# Patient Record
Sex: Female | Born: 1950 | Race: Black or African American | Hispanic: No | Smoking: Former smoker
Health system: Southern US, Community
[De-identification: ages and names within clinical notes are randomized; demographics above are authoritative.]

## PROBLEM LIST (undated history)

## (undated) DIAGNOSIS — C189 Malignant neoplasm of colon, unspecified: Secondary | ICD-10-CM

## (undated) DIAGNOSIS — K921 Melena: Secondary | ICD-10-CM

## (undated) DIAGNOSIS — E119 Type 2 diabetes mellitus without complications: Secondary | ICD-10-CM

## (undated) DIAGNOSIS — D649 Anemia, unspecified: Secondary | ICD-10-CM

## (undated) DIAGNOSIS — I10 Essential (primary) hypertension: Secondary | ICD-10-CM

## (undated) DIAGNOSIS — R7302 Impaired glucose tolerance (oral): Secondary | ICD-10-CM

## (undated) DIAGNOSIS — Z86010 Personal history of colonic polyps: Secondary | ICD-10-CM

## (undated) DIAGNOSIS — E785 Hyperlipidemia, unspecified: Secondary | ICD-10-CM

## (undated) DIAGNOSIS — E669 Obesity, unspecified: Secondary | ICD-10-CM

## (undated) DIAGNOSIS — R413 Other amnesia: Secondary | ICD-10-CM

## (undated) HISTORY — DX: Essential (primary) hypertension: I10

## (undated) HISTORY — DX: Melena: K92.1

## (undated) HISTORY — DX: Anemia, unspecified: D64.9

## (undated) HISTORY — DX: Malignant neoplasm of colon, unspecified: C18.9

## (undated) HISTORY — DX: Type 2 diabetes mellitus without complications: E11.9

## (undated) HISTORY — DX: Personal history of colonic polyps: Z86.010

## (undated) HISTORY — DX: Hyperlipidemia, unspecified: E78.5

## (undated) HISTORY — DX: Obesity, unspecified: E66.9

## (undated) HISTORY — DX: Other amnesia: R41.3

## (undated) HISTORY — DX: Impaired glucose tolerance (oral): R73.02

---

## 1991-03-01 HISTORY — PX: OTHER SURGICAL HISTORY: SHX169

## 1993-02-28 HISTORY — PX: ABDOMINAL HYSTERECTOMY: SUR658

## 1996-10-08 DIAGNOSIS — Z860100 Personal history of colon polyps, unspecified: Secondary | ICD-10-CM

## 1996-10-08 HISTORY — DX: Personal history of colon polyps, unspecified: Z86.0100

## 1996-10-08 HISTORY — DX: Personal history of colonic polyps: Z86.010

## 1999-04-07 ENCOUNTER — Encounter: Payer: Self-pay | Admitting: Family Medicine

## 1999-04-07 ENCOUNTER — Encounter: Admission: RE | Admit: 1999-04-07 | Discharge: 1999-04-07 | Payer: Self-pay | Admitting: Family Medicine

## 2000-11-03 ENCOUNTER — Encounter: Admission: RE | Admit: 2000-11-03 | Discharge: 2000-11-03 | Payer: Self-pay | Admitting: Family Medicine

## 2000-11-03 ENCOUNTER — Encounter: Payer: Self-pay | Admitting: Family Medicine

## 2001-01-30 ENCOUNTER — Ambulatory Visit (HOSPITAL_COMMUNITY): Admission: RE | Admit: 2001-01-30 | Discharge: 2001-01-30 | Payer: Self-pay | Admitting: Gastroenterology

## 2001-02-22 ENCOUNTER — Other Ambulatory Visit: Admission: RE | Admit: 2001-02-22 | Discharge: 2001-02-22 | Payer: Self-pay | Admitting: Family Medicine

## 2001-03-08 ENCOUNTER — Encounter: Payer: Self-pay | Admitting: Family Medicine

## 2001-03-08 ENCOUNTER — Encounter: Admission: RE | Admit: 2001-03-08 | Discharge: 2001-03-08 | Payer: Self-pay | Admitting: Family Medicine

## 2004-11-10 ENCOUNTER — Other Ambulatory Visit: Admission: RE | Admit: 2004-11-10 | Discharge: 2004-11-10 | Payer: Self-pay | Admitting: Family Medicine

## 2004-11-29 ENCOUNTER — Encounter: Admission: RE | Admit: 2004-11-29 | Discharge: 2004-11-29 | Payer: Self-pay | Admitting: Family Medicine

## 2006-01-31 ENCOUNTER — Encounter: Admission: RE | Admit: 2006-01-31 | Discharge: 2006-01-31 | Payer: Self-pay | Admitting: Family Medicine

## 2007-12-31 ENCOUNTER — Encounter: Admission: RE | Admit: 2007-12-31 | Discharge: 2007-12-31 | Payer: Self-pay | Admitting: Family Medicine

## 2011-06-13 ENCOUNTER — Ambulatory Visit: Payer: Self-pay | Admitting: Internal Medicine

## 2011-06-17 ENCOUNTER — Encounter: Payer: Self-pay | Admitting: Internal Medicine

## 2011-06-17 DIAGNOSIS — Z0001 Encounter for general adult medical examination with abnormal findings: Secondary | ICD-10-CM | POA: Insufficient documentation

## 2011-06-17 DIAGNOSIS — Z0000 Encounter for general adult medical examination without abnormal findings: Secondary | ICD-10-CM | POA: Insufficient documentation

## 2011-06-21 ENCOUNTER — Ambulatory Visit (INDEPENDENT_AMBULATORY_CARE_PROVIDER_SITE_OTHER): Payer: Managed Care, Other (non HMO) | Admitting: Internal Medicine

## 2011-06-21 ENCOUNTER — Other Ambulatory Visit (INDEPENDENT_AMBULATORY_CARE_PROVIDER_SITE_OTHER): Payer: Managed Care, Other (non HMO)

## 2011-06-21 ENCOUNTER — Encounter: Payer: Self-pay | Admitting: Internal Medicine

## 2011-06-21 VITALS — BP 130/72 | HR 78 | Temp 98.3°F | Ht 63.0 in | Wt 193.2 lb

## 2011-06-21 DIAGNOSIS — R7302 Impaired glucose tolerance (oral): Secondary | ICD-10-CM

## 2011-06-21 DIAGNOSIS — E119 Type 2 diabetes mellitus without complications: Secondary | ICD-10-CM | POA: Insufficient documentation

## 2011-06-21 DIAGNOSIS — Z0000 Encounter for general adult medical examination without abnormal findings: Secondary | ICD-10-CM

## 2011-06-21 DIAGNOSIS — I10 Essential (primary) hypertension: Secondary | ICD-10-CM

## 2011-06-21 DIAGNOSIS — K635 Polyp of colon: Secondary | ICD-10-CM | POA: Insufficient documentation

## 2011-06-21 DIAGNOSIS — K921 Melena: Secondary | ICD-10-CM

## 2011-06-21 DIAGNOSIS — R7309 Other abnormal glucose: Secondary | ICD-10-CM

## 2011-06-21 DIAGNOSIS — E669 Obesity, unspecified: Secondary | ICD-10-CM

## 2011-06-21 DIAGNOSIS — D126 Benign neoplasm of colon, unspecified: Secondary | ICD-10-CM

## 2011-06-21 DIAGNOSIS — E785 Hyperlipidemia, unspecified: Secondary | ICD-10-CM

## 2011-06-21 HISTORY — DX: Melena: K92.1

## 2011-06-21 HISTORY — DX: Hyperlipidemia, unspecified: E78.5

## 2011-06-21 HISTORY — DX: Impaired glucose tolerance (oral): R73.02

## 2011-06-21 HISTORY — DX: Obesity, unspecified: E66.9

## 2011-06-21 HISTORY — DX: Essential (primary) hypertension: I10

## 2011-06-21 LAB — BASIC METABOLIC PANEL
Calcium: 9.3 mg/dL (ref 8.4–10.5)
GFR: 105.95 mL/min (ref >60.00)
Potassium: 4.1 mEq/L (ref 3.5–5.1)
Sodium: 137 mEq/L (ref 135–145)

## 2011-06-21 LAB — HEPATIC FUNCTION PANEL
Alkaline Phosphatase: 74 U/L (ref 39–117)
Bilirubin, Direct: 0.1 mg/dL (ref 0.0–0.3)

## 2011-06-21 LAB — URINALYSIS, ROUTINE W REFLEX MICROSCOPIC
Bilirubin Urine: NEGATIVE
Ketones, ur: NEGATIVE
Specific Gravity, Urine: 1.025 (ref 1.000–1.030)
Urine Glucose: NEGATIVE
Urobilinogen, UA: 0.2 (ref 0.0–1.0)

## 2011-06-21 LAB — LIPID PANEL
HDL: 57.4 mg/dL (ref >39.00)
Total CHOL/HDL Ratio: 5
Triglycerides: 118 mg/dL (ref 0.0–149.0)

## 2011-06-21 LAB — CBC WITH DIFFERENTIAL/PLATELET
Basophils Absolute: 0 10*3/uL (ref 0.0–0.1)
Eosinophils Absolute: 0.2 10*3/uL (ref 0.0–0.7)
HCT: 34.9 % — ABNORMAL LOW (ref 36.0–46.0)
Hemoglobin: 11.1 g/dL — ABNORMAL LOW (ref 12.0–15.0)
Lymphs Abs: 2.2 10*3/uL (ref 0.7–4.0)
MCHC: 31.7 g/dL (ref 30.0–36.0)
MCV: 71 fl — ABNORMAL LOW (ref 78.0–100.0)
Monocytes Absolute: 0.5 10*3/uL (ref 0.1–1.0)
Neutro Abs: 5.5 10*3/uL (ref 1.4–7.7)
RDW: 17.2 % — ABNORMAL HIGH (ref 11.5–14.6)

## 2011-06-21 NOTE — Assessment & Plan Note (Signed)

## 2011-06-21 NOTE — Patient Instructions (Signed)
Please call for insurance to see if the shingles shot is covered You will be contacted regarding the referral for: colonoscopy Please go to LAB in the Basement for the blood and/or urine tests to be done today You will be contacted by phone if any changes need to be made immediately.  Otherwise, you will receive a letter about your results with an explanation. Please return in 1 year for your yearly visit, or sooner if needed, with Lab testing done 3-5 days before

## 2011-06-21 NOTE — Assessment & Plan Note (Signed)
stable overall by hx and exam, , and pt to continue medical treatment as before   

## 2011-06-21 NOTE — Progress Notes (Signed)
Subjective:    Patient ID: Sarah Berger, female    DOB: 1950/04/08, 61 y.o.   MRN: [id number]  HPI  Here to establish as new pt, formerly pt of Dr Arvilla Market now retired. Co-workers are pt's of mine as well. Here for wellness and f/u;  Overall doing ok;  Pt denies CP, worsening SOB, DOE, wheezing, orthopnea, PND, worsening LE edema, palpitations, dizziness or syncope.  Pt denies neurological change such as new Headache, facial or extremity weakness.  Pt denies polydipsia, polyuria, or low sugar symptoms. Pt states overall good compliance with treatment and medications, good tolerability, and trying to follow lower cholesterol diet.  Pt denies worsening depressive symptoms, suicidal ideation or panic. No fever, wt loss, night sweats, loss of appetite, or other constitutional symptoms.  Pt states good ability with ADL's, low fall risk, home safety reviewed and adequate, no significant changes in hearing or vision, and occasionally active with exercise. Did have an episode of small volume BRBPR painless to commode , no recurrence.  Has hx of colonoscopy x 2 (last approx 10 yrs) with hx of polyps on first, none on second.  Mother with hx of colon polyps as well. Past Medical History  Diagnosis Date  . Blood in stool   . Obesity 06/21/2011  . Impaired glucose tolerance 06/21/2011  . Hyperlipidemia 06/21/2011  . HTN (hypertension) 06/21/2011    Several mild readings in the past, has decline med tx  . Colon polyps 06/21/2011  . Hematochezia 06/21/2011   Past Surgical History  Procedure Date  . Abdominal hysterectomy   . Abdominal hysterectomy     reports that she has quit smoking. She has never used smokeless tobacco. She reports that she does not drink alcohol or use illicit drugs. family history includes Hypertension in her mother. No Known Allergies No current outpatient prescriptions on file prior to visit.   Review of Systems Review of Systems  Constitutional: Negative for diaphoresis, activity  change, appetite change and unexpected weight change.  HENT: Negative for hearing loss, ear pain, facial swelling, mouth sores and neck stiffness.   Eyes: Negative for pain, redness and visual disturbance.  Respiratory: Negative for shortness of breath and wheezing.   Cardiovascular: Negative for chest pain and palpitations.  Gastrointestinal: Negative for diarrhea, blood in stool, abdominal distention and rectal pain.  Genitourinary: Negative for hematuria, flank pain and decreased urine volume.  Musculoskeletal: Negative for myalgias and joint swelling.  Skin: Negative for color change and wound.  Neurological: Negative for syncope and numbness.  Hematological: Negative for adenopathy.  Psychiatric/Behavioral: Negative for hallucinations, self-injury, decreased concentration and agitation.      Objective:   Physical Exam BP 130/72  Pulse 78  Temp(Src) 98.3 F (36.8 C) (Oral)  Ht 5\' 3"  (1.6 m)  Wt 193 lb 4 oz (87.658 kg)  BMI 34.23 kg/m2  SpO2 98% Physical Exam  VS noted Constitutional: Pt is oriented to person, place, and time. Appears well-developed and well-nourished.  HENT:  Head: Normocephalic and atraumatic.  Right Ear: External ear normal.  Left Ear: External ear normal.  Nose: Nose normal.  Mouth/Throat: Oropharynx is clear and moist.  Eyes: Conjunctivae and EOM are normal. Pupils are equal, round, and reactive to light.  Neck: Normal range of motion. Neck supple. No JVD present. No tracheal deviation present.  Cardiovascular: Normal rate, regular rhythm, normal heart sounds and intact distal pulses.   Pulmonary/Chest: Effort normal and breath sounds normal.  Abdominal: Soft. Bowel sounds are normal. There is no  tenderness.  Musculoskeletal: Normal range of motion. Exhibits no edema.  Lymphadenopathy:  Has no cervical adenopathy.  Neurological: Pt is alert and oriented to person, place, and time. Pt has normal reflexes. No cranial nerve deficit.  Skin: Skin is warm  and dry. No rash noted.  Psychiatric:  Has  normal mood and affect. Behavior is normal.    Assessment & Plan:

## 2011-06-22 ENCOUNTER — Encounter: Payer: Self-pay | Admitting: Internal Medicine

## 2011-06-22 ENCOUNTER — Other Ambulatory Visit: Payer: Self-pay | Admitting: Internal Medicine

## 2011-06-22 DIAGNOSIS — E119 Type 2 diabetes mellitus without complications: Secondary | ICD-10-CM

## 2011-06-22 LAB — IBC PANEL
Saturation Ratios: 19.2 % — ABNORMAL LOW (ref 20.0–50.0)
Transferrin: 286.7 mg/dL (ref 212.0–360.0)

## 2011-06-22 MED ORDER — METFORMIN HCL 500 MG PO TABS: 500.0000 mg | ORAL_TABLET | Freq: Two times a day (BID) | ORAL | Status: DC

## 2011-06-22 MED ORDER — ATORVASTATIN CALCIUM 20 MG PO TABS: 20.0000 mg | ORAL_TABLET | Freq: Every day | ORAL | Status: DC

## 2011-06-22 MED ORDER — ASPIRIN 81 MG PO TBEC: 81.0000 mg | DELAYED_RELEASE_TABLET | Freq: Every day | ORAL | Status: AC

## 2011-06-23 ENCOUNTER — Encounter: Payer: Self-pay | Admitting: Internal Medicine

## 2011-07-14 ENCOUNTER — Telehealth: Payer: Self-pay | Admitting: Internal Medicine

## 2011-07-14 ENCOUNTER — Encounter: Payer: Self-pay | Admitting: Internal Medicine

## 2011-07-14 NOTE — Telephone Encounter (Signed)
Received copies from [facility] ,on 07/14/11. Forwarded 12 pages to Dr. Rhea Belton ,for review.

## 2011-07-18 ENCOUNTER — Encounter: Payer: Self-pay | Admitting: Internal Medicine

## 2011-07-18 ENCOUNTER — Ambulatory Visit (INDEPENDENT_AMBULATORY_CARE_PROVIDER_SITE_OTHER): Payer: Managed Care, Other (non HMO) | Admitting: Internal Medicine

## 2011-07-18 VITALS — BP 124/68 | HR 80 | Ht 63.0 in | Wt 194.0 lb

## 2011-07-18 DIAGNOSIS — Z86010 Personal history of colonic polyps: Secondary | ICD-10-CM

## 2011-07-18 DIAGNOSIS — D126 Benign neoplasm of colon, unspecified: Secondary | ICD-10-CM

## 2011-07-18 DIAGNOSIS — K921 Melena: Secondary | ICD-10-CM

## 2011-07-18 DIAGNOSIS — K635 Polyp of colon: Secondary | ICD-10-CM

## 2011-07-18 MED ORDER — PEG-KCL-NACL-NASULF-NA ASC-C 100 G PO SOLR
1.0000 | Freq: Once | ORAL | Status: DC
Start: 2011-07-18 — End: 2011-08-23

## 2011-07-18 NOTE — Patient Instructions (Signed)
You have been scheduled for a colonoscopy with propofol. Please follow written instructions given to you at your visit today.  Please pick up your prep kit at the pharmacy within the next 1-3 days.  We have sent the following medications to your pharmacy for you to pick up at your convenience: Moviprep; you were given instructions today at your visit.    

## 2011-07-19 ENCOUNTER — Encounter: Payer: Self-pay | Admitting: Internal Medicine

## 2011-07-19 NOTE — Progress Notes (Signed)
Subjective:    Patient ID: Sarah Berger, female    DOB: 1951/01/04, 61 y.o.   MRN: [id number]  HPI Sarah Berger is a 61 year old female the past medical history of hypertension, hyperlipidemia and colon polyps who seen in consultation at the request of Dr. Jonny Ruiz for evaluation of an isolated episode of hematochezia. The patient reports that approximately one month ago she developed an isolated episode of hematochezia. This was painless in nature. It has not recurred. Over all, and otherwise she is doing well. She reports one to 2 bowel movements per day which are soft and formed. Her stools are brown without blood or melena of late. She denies abdominal pain. No diarrhea. No constipation. Appetite is good. No loss of weight. No nausea or vomiting. No fevers or chills  She does have a history of 2 prior colonoscopies and she notes a history of colon polyps, unknown histopathologic type.  Review of Systems As per history of present illness, otherwise neg  Patient Active Problem List  Diagnoses  . Preventative health care  . Obesity  . Impaired glucose tolerance  . Hyperlipidemia  . HTN (hypertension)  . Colon polyps  . Hematochezia   Past Surgical History  Procedure Date  . Abdominal hysterectomy    Current Outpatient Prescriptions  Medication Sig Dispense Refill  . aspirin 81 MG EC tablet Take 1 tablet (81 mg total) by mouth daily. Swallow whole.  30 tablet  12  . atorvastatin (LIPITOR) 20 MG tablet Take 1 tablet (20 mg total) by mouth daily.  90 tablet  3  . metFORMIN (GLUCOPHAGE) 500 MG tablet Take 1 tablet (500 mg total) by mouth 2 (two) times daily with a meal.  180 tablet  3  . peg 3350 powder (MOVIPREP) 100 G SOLR Take 1 kit (100 g total) by mouth once.  1 kit  0   No Known Allergies  Family History  Problem Relation Age of Onset  . Hypertension Mother   . Colon cancer Neg Hx   . Colon polyps Mother    History  Substance Use Topics  . Smoking status: Former Games developer  .  Smokeless tobacco: Never Used   Comment: quit 30 years ago.  . Alcohol Use: No       Objective:   Physical Exam BP 124/68  Pulse 80  Ht 5\' 3"  (1.6 m)  Wt 194 lb (87.998 kg)  BMI 34.37 kg/m2 Constitutional: Well-developed and well-nourished. No distress. HEENT: Normocephalic and atraumatic. Oropharynx is clear and moist. No oropharyngeal exudate. Conjunctivae are normal. Pupils are equal round and reactive to light. No scleral icterus. Neck: Neck supple. Trachea midline. Cardiovascular: Normal rate, regular rhythm and intact distal pulses. No M/R/G Pulmonary/chest: Effort normal and breath sounds normal. No wheezing, rales or rhonchi. Abdominal: Soft, nontender, nondistended. Bowel sounds active throughout. There are no masses palpable. No hepatosplenomegaly. Extremities: no clubbing, cyanosis, or edema Lymphadenopathy: No cervical adenopathy noted. Neurological: Alert and oriented to person place and time. Skin: Skin is warm and dry. No rashes noted. Psychiatric: Normal mood and affect. Behavior is normal.  CBC    Component Value Date/Time   WBC 8.4 06/21/2011 1033   RBC 4.92 06/21/2011 1033   HGB 11.1* 06/21/2011 1033   HCT 34.9* 06/21/2011 1033   PLT 273.0 06/21/2011 1033   MCV 71.0* 06/21/2011 1033   MCHC 31.7 06/21/2011 1033   RDW 17.2* 06/21/2011 1033   LYMPHSABS 2.2 06/21/2011 1033   MONOABS 0.5 06/21/2011 1033   EOSABS 0.2  06/21/2011 1033   BASOSABS 0.0 06/21/2011 1033   CMP     Component Value Date/Time   NA 137 06/21/2011 1033   K 4.1 06/21/2011 1033   CL 101 06/21/2011 1033   CO2 26 06/21/2011 1033   GLUCOSE 171* 06/21/2011 1033   BUN 11 06/21/2011 1033   CREATININE 0.7 06/21/2011 1033   CALCIUM 9.3 06/21/2011 1033   PROT 8.5* 06/21/2011 1033   ALBUMIN 4.2 06/21/2011 1033   AST 17 06/21/2011 1033   ALT 12 06/21/2011 1033   ALKPHOS 74 06/21/2011 1033   BILITOT 0.5 06/21/2011 1033   Iron/TIBC/Ferritin    Component Value Date/Time   IRON 77 06/21/2011 1033   Prior  colonoscopies: #1. 10/08/1996 -- 2.0 cm pedunculated polyp removed in the sigmoid, no other polyps or tumors seen. The remainder of the colon was normal. Procedure was performed due to anemia, was to the cecum with good prep. #2. 02/23/2006 -- sessile polyp in the mid ascending colon, 7 mm in size, removed with saline lift injection technique using hot snare. Resection retrieval complete. Multiple diverticula were found in the sigmoid colon. There was mild tortuosity but no stricture or diverticulitis or bleeding.    Assessment & Plan:  61 year old female the past medical history of hypertension, hyperlipidemia and colon polyps who seen in consultation at the request of Dr. Jonny Ruiz for evaluation of an isolated episode of hematochezia  1. Isolated hematochezia/history of colon polyps -- based on the patient's prior history of colon polyps, she is due for repeat surveillance colonoscopy now. This test will also suffice to better evaluate her isolated episode of hematochezia. We discussed the procedure including the risks and benefits, and she is agreeable to proceed. Her anemia, appears microcytic, and is long-standing. RR level was normal as was her TIBC. No ferritin was drawn. This is likely a mixed picture anemia. We will proceed with colonoscopy.  Further recommended history made after colonoscopy.

## 2011-08-08 ENCOUNTER — Ambulatory Visit: Payer: Managed Care, Other (non HMO) | Admitting: Internal Medicine

## 2011-08-23 ENCOUNTER — Encounter: Payer: Self-pay | Admitting: Internal Medicine

## 2011-08-23 ENCOUNTER — Telehealth: Payer: Self-pay

## 2011-08-23 ENCOUNTER — Telehealth: Payer: Self-pay | Admitting: *Deleted

## 2011-08-23 ENCOUNTER — Other Ambulatory Visit (INDEPENDENT_AMBULATORY_CARE_PROVIDER_SITE_OTHER): Payer: Managed Care, Other (non HMO)

## 2011-08-23 ENCOUNTER — Ambulatory Visit (AMBULATORY_SURGERY_CENTER): Payer: Managed Care, Other (non HMO) | Admitting: Internal Medicine

## 2011-08-23 VITALS — BP 122/75 | HR 90 | Temp 96.1°F | Resp 18 | Ht 63.0 in | Wt 194.0 lb

## 2011-08-23 DIAGNOSIS — K635 Polyp of colon: Secondary | ICD-10-CM

## 2011-08-23 DIAGNOSIS — K6389 Other specified diseases of intestine: Secondary | ICD-10-CM

## 2011-08-23 DIAGNOSIS — D126 Benign neoplasm of colon, unspecified: Secondary | ICD-10-CM

## 2011-08-23 DIAGNOSIS — Z86010 Personal history of colonic polyps: Secondary | ICD-10-CM

## 2011-08-23 DIAGNOSIS — K921 Melena: Secondary | ICD-10-CM

## 2011-08-23 LAB — COMPREHENSIVE METABOLIC PANEL
BUN: 9 mg/dL (ref 6–23)
CO2: 25 mEq/L (ref 19–32)
Calcium: 8.9 mg/dL (ref 8.4–10.5)
Chloride: 107 mEq/L (ref 96–112)
Creatinine, Ser: 0.7 mg/dL (ref 0.4–1.2)
GFR: 105.89 mL/min (ref >60.00)
Glucose, Bld: 122 mg/dL — ABNORMAL HIGH (ref 70–99)

## 2011-08-23 MED ORDER — SODIUM CHLORIDE 0.9 % IV SOLN: 500.0000 mL | INTRAVENOUS | Status: DC

## 2011-08-23 NOTE — Op Note (Signed)
[facility] [address] [place], [place]  [address]  COLONOSCOPY PROCEDURE REPORT  PATIENT:  Sarah Berger, Sarah Berger  MR#:  [id number] BIRTHDATE:  1950/06/03, 61 yrs. old  GENDER:  female ENDOSCOPIST:  Carie Caddy. Jim Lundin, MD REF. BY:  Oliver Barre, M.D. PROCEDURE DATE:  08/23/2011 PROCEDURE:  Colonoscopy with biopsy ASA CLASS:  Class II INDICATIONS:  hematochezia, history of pre-cancerous (adenomatous) colon polyps, last colonoscopy 2007 MEDICATIONS:   MAC sedation, administered by CRNA, propofol (Diprivan) 250 mg IV  DESCRIPTION OF PROCEDURE:   After the risks benefits and alternatives of the procedure were thoroughly explained, informed consent was obtained.  Digital rectal exam was performed and revealed no abnormalities.   The LB CF-[id number] endoscope was introduced through the anus and advanced to the terminal ileum which was intubated for a short distance, without limitations. The quality of the prep was good, using MoviPrep.  The instrument was then slowly withdrawn as the colon was fully examined. <<PROCEDUREIMAGES>>  FINDINGS:  The terminal ileum appeared normal.  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 This mass involved 50-75% of the colonic lumen and was approximately 5 cm in length.  It is non-obstructing at present.  Multiple biopsies were obtained and sent to pathology.  Tattoo was placed using [place] ink at the proximal and distal margins.  Mild diverticulosis was found in the left colon.   Retroflexed views in the rectum revealed internal hemorrhoids.    The scope was then withdrawn from the cecum and the procedure completed.  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  Multiple biopsies performed and sent to pathology.  Tattoo placed. 3) Mild diverticulosis in the left colon 4) Internal hemorrhoids  RECOMMENDATIONS: 1) Await pathology results 2) CT scan chest,  abdomen and pelvis 3) Referral to general surgery  [name] M. Rhea Belton, MD  CC:  Corwin Levins, MD The Patient  n. eSIGNED:   Carie Caddy. [name] at 08/23/2011 02:12 PM  [name], [phone]

## 2011-08-23 NOTE — Telephone Encounter (Signed)
TOOK INSTRUCTIONS AND PREP TO PT IN [facility].

## 2011-08-23 NOTE — Progress Notes (Signed)
Patient did not experience any of the following events: a burn prior to discharge; a fall within the facility; wrong site/side/patient/procedure/implant event; or a hospital transfer or hospital admission upon discharge from the facility. (G8907) Patient did not have preoperative order for IV antibiotic SSI prophylaxis. (G8918)  

## 2011-08-23 NOTE — Patient Instructions (Addendum)
Findings:  Mass in the ascending colon.  Multiple biopsies taken and sent to pathology.  Mild diverticulosis and internal hemorrhoids. Recommendations:  Wait for biopsy results, CT scan chest, abd, and pelvis.  Referral to general surgery.  CT SCAN SCHEDULED FOR June 27th 2013 AT [place] [facility] [place].  BE THERE AT 9:45 am.  Instructions given for constrast.    YOU HAD AN ENDOSCOPIC PROCEDURE TODAY AT THE [facility]: Refer to the procedure report that was given to you for any specific questions about what was found during the examination.  If the procedure report does not answer your questions, please call your gastroenterologist to clarify.  If you requested that your care partner not be given the details of your procedure findings, then the procedure report has been included in a sealed envelope for you to review at your convenience later.  YOU SHOULD EXPECT: Some feelings of bloating in the abdomen. Passage of more gas than usual.  Walking can help get rid of the air that was put into your GI tract during the procedure and reduce the bloating. If you had a lower endoscopy (such as a colonoscopy or flexible sigmoidoscopy) you may notice spotting of blood in your stool or on the toilet paper. If you underwent a bowel prep for your procedure, then you may not have a normal bowel movement for a few days.  DIET: Your first meal following the procedure should be a light meal and then it is ok to progress to your normal diet.  A half-sandwich or bowl of soup is an example of a good first meal.  Heavy or fried foods are harder to digest and may make you feel nauseous or bloated.  Likewise meals heavy in dairy and vegetables can cause extra gas to form and this can also increase the bloating.  Drink plenty of fluids but you should avoid alcoholic beverages for 24 hours.  ACTIVITY: Your care partner should take you home directly after the procedure.  You should plan to take it easy, moving slowly  for the rest of the day.  You can resume normal activity the day after the procedure however you should NOT DRIVE or use heavy machinery for 24 hours (because of the sedation medicines used during the test).    SYMPTOMS TO REPORT IMMEDIATELY: A gastroenterologist can be reached at any hour.  During normal business hours, 8:30 AM to 5:00 PM Monday through Friday, call [phone].  After hours and on weekends, please call the GI answering service at [phone] who will take a message and have the physician on call contact you.   Following lower endoscopy (colonoscopy or flexible sigmoidoscopy):  Excessive amounts of blood in the stool  Significant tenderness or worsening of abdominal pains  Swelling of the abdomen that is new, acute  Fever of 100F or higher  Following upper endoscopy (EGD)  Vomiting of blood or coffee ground material  New chest pain or pain under the shoulder blades  Painful or persistently difficult swallowing  New shortness of breath  Fever of 100F or higher  Black, tarry-looking stools  FOLLOW UP: If any biopsies were taken you will be contacted by phone or by letter within the next 1-3 weeks.  Call your gastroenterologist if you have not heard about the biopsies in 3 weeks.  Our staff will call the home number listed on your records the next business day following your procedure to check on you and address any questions or concerns  that you may have at that time regarding the information given to you following your procedure. This is a courtesy call and so if there is no answer at the home number and we have not heard from you through the emergency physician on call, we will assume that you have returned to your regular daily activities without incident.  SIGNATURES/CONFIDENTIALITY: You and/or your care partner have signed paperwork which will be entered into your electronic medical record.  These signatures attest to the fact that that the information above on  your After Visit Summary has been reviewed and is understood.  Full responsibility of the confidentiality of this discharge information lies with you and/or your care-partner.   Please follow all discharge instructions given to you by the recovery room nurse. If you have any questions or problems after discharge please call one of the numbers listed above. You will receive a phone call in the am to see how you are doing and answer any questions you may have. Thank you for choosing [facility] for your health care needs.

## 2011-08-23 NOTE — Telephone Encounter (Signed)
Message copied by Pincus Sanes on Tue Aug 23, 2011  4:52 PM ------      Message from: Florene Glen      Created: Tue Aug 23, 2011  3:00 PM       Can you please remove Glucophage from pt's medlist. I gave her instructions for a CT today and it was very confusing to pt and [place] because she is not on it per your note. Please call if you don't understand this note; [phone]. Thanks, Aram Beecham

## 2011-08-23 NOTE — Telephone Encounter (Signed)
Pt IS supposed to be on metformin  Last a1c 9.1 in April 2013;  After that visit:  Letter sent, cont same tx The test results show that your current treatment is working except you have new Diabetes with the A1c at 9.1 (needs to be at least less than 7.0), and the Cholesterol LDL is severe high (needs to be less than 70). Please start the new medication (you should have received a phone call before getting this letter), and we will need to see you back in 2 months (with blood work done just before the next visit). Please continue all other treatment without change at this time, except you should also start Aspirin 81 mg (OTC) - 1 per day (coated only) to help reduce risk of stroke and heart disease.  1) Start metformin 500 bid 2) Start lipitor 20 qd 3) ROV 2 mo with labs done 3-5 days ahead Robin to inform pt, I will do rx/order  ROBIN to contact pt - to let pt know above, really needs to take the med and f/u in 2 months

## 2011-08-23 NOTE — Progress Notes (Signed)
Sarah Berger in to talk with pt and go over CT Scan instructions.  [phone]

## 2011-08-24 ENCOUNTER — Telehealth: Payer: Self-pay | Admitting: *Deleted

## 2011-08-24 DIAGNOSIS — C182 Malignant neoplasm of ascending colon: Secondary | ICD-10-CM

## 2011-08-24 NOTE — Telephone Encounter (Signed)
Thanks, Dr Jonny Ruiz. You saw the  [facility] yesterday after her procedure, 122?

## 2011-08-24 NOTE — Telephone Encounter (Signed)
  Follow up Call-  Call back number 08/23/2011  Post procedure Call Back phone  # [phone]  Permission to leave phone message Yes     Patient questions:  Note written earlier states not to leave a message at any #

## 2011-08-24 NOTE — Telephone Encounter (Signed)
I called the patient informed of MD's instructions on lab results and medications.  She informed she does not want to start any new medication at this time, she has changed her diet/exercise and would like to see if this alone will help.  She did agree to come back in 2 months for ROV.

## 2011-08-25 ENCOUNTER — Ambulatory Visit (INDEPENDENT_AMBULATORY_CARE_PROVIDER_SITE_OTHER)
Admission: RE | Admit: 2011-08-25 | Discharge: 2011-08-25 | Disposition: A | Discharge status: Home / Self Care | Payer: Managed Care, Other (non HMO) | Source: Ambulatory Visit | Attending: Internal Medicine | Admitting: Internal Medicine

## 2011-08-25 DIAGNOSIS — K6389 Other specified diseases of intestine: Secondary | ICD-10-CM

## 2011-08-25 MED ORDER — IOHEXOL 300 MG/ML  SOLN
100.0000 mL | Freq: Once | INTRAMUSCULAR | Status: AC | PRN
  Administered 2011-08-25: 100 mL via INTRAVENOUS

## 2011-08-25 NOTE — Telephone Encounter (Signed)
Informed pt of her referral to Dr Donell Beers. Pt states she has been thinking about it and may want to go to [facility]. Spoke with Dr Rhea Belton who would refer pt to Dr Donnetta Hutching for surgery. He recommends she choose her oncologist at the same facility; if she chooses [facility], she needs an Best boy there. Pt stated understanding.  Scheduled pt for Dr Donnetta Hutching, Friday, September 02, 2011 at 12:15pm  [phone], fax [phone]. And Dr Aaron Mose, Oncologist, 09/08/11 at 2pm [phone], fax [phone]. Faxed info to both offices and emailed info to pt.

## 2011-09-01 DIAGNOSIS — K760 Fatty (change of) liver, not elsewhere classified: Secondary | ICD-10-CM | POA: Insufficient documentation

## 2011-09-01 DIAGNOSIS — C189 Malignant neoplasm of colon, unspecified: Secondary | ICD-10-CM | POA: Insufficient documentation

## 2011-09-07 ENCOUNTER — Encounter (INDEPENDENT_AMBULATORY_CARE_PROVIDER_SITE_OTHER): Payer: Managed Care, Other (non HMO) | Admitting: General Surgery

## 2011-09-23 ENCOUNTER — Ambulatory Visit: Payer: Managed Care, Other (non HMO) | Admitting: Internal Medicine

## 2011-10-05 DIAGNOSIS — Z90710 Acquired absence of both cervix and uterus: Secondary | ICD-10-CM | POA: Insufficient documentation

## 2011-10-05 DIAGNOSIS — Z780 Asymptomatic menopausal state: Secondary | ICD-10-CM | POA: Insufficient documentation

## 2011-10-11 DIAGNOSIS — G8918 Other acute postprocedural pain: Secondary | ICD-10-CM | POA: Insufficient documentation

## 2011-10-20 ENCOUNTER — Ambulatory Visit: Payer: Managed Care, Other (non HMO) | Admitting: Internal Medicine

## 2011-10-30 HISTORY — PX: HEMICOLECTOMY: SHX854

## 2011-11-15 ENCOUNTER — Telehealth: Payer: Self-pay | Admitting: Internal Medicine

## 2011-11-15 NOTE — Telephone Encounter (Signed)
Fax records received from Dr. Donnetta Hutching. 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 No evidence of distal metastasis. Final pathologic stage T3N0Mx She will seek oncologic care at [facility] She will be due repeat colonoscopy in June 2014, recall to be placed today

## 2012-09-26 ENCOUNTER — Telehealth: Payer: Self-pay | Admitting: Internal Medicine

## 2012-09-26 ENCOUNTER — Telehealth: Payer: Self-pay

## 2012-09-26 DIAGNOSIS — Z0000 Encounter for general adult medical examination without abnormal findings: Secondary | ICD-10-CM

## 2012-09-26 NOTE — Telephone Encounter (Signed)
Pt had surgery at [facility] for Colon Cancer and per Dr Abigail Miyamoto, she needs a repeat COLON. Pt will come for PV on 10/04/12 and her COLON will be 10/10/12.

## 2012-09-26 NOTE — Telephone Encounter (Signed)
CPX labs entered  

## 2012-10-04 ENCOUNTER — Ambulatory Visit (AMBULATORY_SURGERY_CENTER): Payer: Managed Care, Other (non HMO) | Admitting: *Deleted

## 2012-10-04 VITALS — Ht 63.0 in | Wt 194.6 lb

## 2012-10-04 DIAGNOSIS — Z85038 Personal history of other malignant neoplasm of large intestine: Secondary | ICD-10-CM

## 2012-10-04 MED ORDER — MOVIPREP 100 G PO SOLR: ORAL | Status: DC

## 2012-10-05 ENCOUNTER — Encounter: Payer: Self-pay | Admitting: Internal Medicine

## 2012-10-10 ENCOUNTER — Encounter: Payer: Self-pay | Admitting: Internal Medicine

## 2012-10-10 ENCOUNTER — Ambulatory Visit (AMBULATORY_SURGERY_CENTER): Payer: Private Health Insurance - Indemnity | Admitting: Internal Medicine

## 2012-10-10 VITALS — BP 138/78 | HR 66 | Temp 97.1°F | Resp 15 | Ht 63.0 in | Wt 194.0 lb

## 2012-10-10 DIAGNOSIS — Z85038 Personal history of other malignant neoplasm of large intestine: Secondary | ICD-10-CM

## 2012-10-10 DIAGNOSIS — Z1211 Encounter for screening for malignant neoplasm of colon: Secondary | ICD-10-CM

## 2012-10-10 MED ORDER — SODIUM CHLORIDE 0.9 % IV SOLN: 500.0000 mL | INTRAVENOUS | Status: DC

## 2012-10-10 NOTE — Progress Notes (Signed)
Pt stable to RR 

## 2012-10-10 NOTE — Op Note (Signed)
[facility] [address] [place], [address]   COLONOSCOPY PROCEDURE REPORT  PATIENT: Sarah Berger, Sarah Berger.  MR#: [id number] BIRTHDATE: [birth year] , 62  yrs. old GENDER: Female ENDOSCOPIST: Beverley Fiedler, MD PROCEDURE DATE:  10/10/2012 PROCEDURE:   Colonoscopy, surveillance First Screening Colonoscopy - Avg.  risk and is 50 yrs.  old or older - No.  Prior Negative Screening - Now for repeat screening. N/A  History of Adenoma - Now for follow-up colonoscopy & has been > or = to 3 yrs.  No.  It has been less than 3 yrs since last colonoscopy.  Medical reason.  Polyps Removed Today? No.  Recommend repeat exam, <10 yrs? Yes.  High risk (family or personal hx). ASA CLASS:   Class III INDICATIONS:High risk patient with personal history of colon cancer, s/p right hemicolectomy Aug 2014. MEDICATIONS: MAC sedation, administered by CRNA and propofol (Diprivan) 200mg  IV  DESCRIPTION OF PROCEDURE:   After the risks benefits and alternatives of the procedure were thoroughly explained, informed consent was obtained.  A digital rectal exam revealed no rectal mass.   The LB PFC-H190 [id number]  endoscope was introduced through the anus and advanced to the terminal ileum which was intubated for a short distance. No adverse events experienced.   The quality of the prep was good, using MoviPrep  The instrument was then slowly withdrawn as the colon was fully examined.   COLON FINDINGS: Normal examined neo-terminal ileum (1 staple noted). There was evidence of a normal and healthy appearing prior surgical anastomosis in the transverse colon.   Mild diverticulosis was noted in the sigmoid colon.   The colon mucosa was otherwise normal.  Retroflexed views revealed no abnormalities. The time to cecum=2 minutes 32 seconds.  Withdrawal time=9 minutes 01 seconds. The scope was withdrawn and the procedure completed. COMPLICATIONS: There were no complications.  ENDOSCOPIC IMPRESSION: 1.    Normal neo-terminal ileal mucosa 2.   Healthy appearing ileocolonic anastomosis in the transverse colon 3.   Mild diverticulosis was noted in the sigmoid colon 4.   The colon mucosa was otherwise normal  RECOMMENDATIONS: 1.  High fiber diet 2.  Repeat Colonoscopy in 3 years.   eSigned:  Beverley Fiedler, MD 10/10/2012 10:43 AM  cc: The Patient and Corwin Levins, MD  and Donnetta Hutching, MD [facility])

## 2012-10-10 NOTE — Patient Instructions (Addendum)
YOU HAD AN ENDOSCOPIC PROCEDURE TODAY AT THE [facility]: Refer to the procedure report that was given to you for any specific questions about what was found during the examination.  If the procedure report does not answer your questions, please call your gastroenterologist to clarify.  If you requested that your care partner not be given the details of your procedure findings, then the procedure report has been included in a sealed envelope for you to review at your convenience later.  YOU SHOULD EXPECT: Some feelings of bloating in the abdomen. Passage of more gas than usual.  Walking can help get rid of the air that was put into your GI tract during the procedure and reduce the bloating. If you had a lower endoscopy (such as a colonoscopy or flexible sigmoidoscopy) you may notice spotting of blood in your stool or on the toilet paper. If you underwent a bowel prep for your procedure, then you may not have a normal bowel movement for a few days.  DIET: Your first meal following the procedure should be a light meal and then it is ok to progress to your normal diet.  A half-sandwich or bowl of soup is an example of a good first meal.  Heavy or fried foods are harder to digest and may make you feel nauseous or bloated.  Likewise meals heavy in dairy and vegetables can cause extra gas to form and this can also increase the bloating.  Drink plenty of fluids but you should avoid alcoholic beverages for 24 hours.  ACTIVITY: Your care partner should take you home directly after the procedure.  You should plan to take it easy, moving slowly for the rest of the day.  You can resume normal activity the day after the procedure however you should NOT DRIVE or use heavy machinery for 24 hours (because of the sedation medicines used during the test).    SYMPTOMS TO REPORT IMMEDIATELY: A gastroenterologist can be reached at any hour.  During normal business hours, 8:30 AM to 5:00 PM Monday through Friday,  call [phone].  After hours and on weekends, please call the GI answering service at [phone] who will take a message and have the physician on call contact you.   Following lower endoscopy (colonoscopy or flexible sigmoidoscopy):  Excessive amounts of blood in the stool  Significant tenderness or worsening of abdominal pains  Swelling of the abdomen that is new, acute  Fever of 100F or higher   FOLLOW UP: If any biopsies were taken you will be contacted by phone or by letter within the next 1-3 weeks.  Call your gastroenterologist if you have not heard about the biopsies in 3 weeks.  Our staff will call the home number listed on your records the next business day following your procedure to check on you and address any questions or concerns that you may have at that time regarding the information given to you following your procedure. This is a courtesy call and so if there is no answer at the home number and we have not heard from you through the emergency physician on call, we will assume that you have returned to your regular daily activities without incident.  SIGNATURES/CONFIDENTIALITY: You and/or your care partner have signed paperwork which will be entered into your electronic medical record.  These signatures attest to the fact that that the information above on your After Visit Summary has been reviewed and is understood.  Full responsibility of the confidentiality of   this discharge information lies with you and/or your care-partner.    INFORMATION ON DIVERTICULOSIS & HIGH FIBER DIET GIVEN TO YOU TODAY 

## 2012-10-10 NOTE — Progress Notes (Signed)
Patient did not experience any of the following events: a burn prior to discharge; a fall within the facility; wrong site/side/patient/procedure/implant event; or a hospital transfer or hospital admission upon discharge from the facility. (G8907) Patient did not have preoperative order for IV antibiotic SSI prophylaxis. (G8918)  

## 2012-10-11 ENCOUNTER — Telehealth: Payer: Self-pay | Admitting: *Deleted

## 2012-10-11 NOTE — Telephone Encounter (Signed)
  Follow up Call-  Call back number 10/10/2012 08/23/2011  Post procedure Call Back phone  # [phone] cell [phone]  Permission to leave phone message Yes Yes    LMOM

## 2012-10-23 ENCOUNTER — Ambulatory Visit (INDEPENDENT_AMBULATORY_CARE_PROVIDER_SITE_OTHER): Payer: Private Health Insurance - Indemnity

## 2012-10-23 ENCOUNTER — Encounter: Payer: Self-pay | Admitting: Internal Medicine

## 2012-10-23 DIAGNOSIS — E538 Deficiency of other specified B group vitamins: Secondary | ICD-10-CM

## 2012-10-23 DIAGNOSIS — Z0000 Encounter for general adult medical examination without abnormal findings: Secondary | ICD-10-CM

## 2012-10-23 DIAGNOSIS — R7309 Other abnormal glucose: Secondary | ICD-10-CM

## 2012-10-23 DIAGNOSIS — D649 Anemia, unspecified: Secondary | ICD-10-CM

## 2012-10-23 DIAGNOSIS — D6489 Other specified anemias: Secondary | ICD-10-CM

## 2012-10-23 HISTORY — DX: Anemia, unspecified: D64.9

## 2012-10-23 LAB — URINALYSIS, ROUTINE W REFLEX MICROSCOPIC
Bilirubin Urine: NEGATIVE
Ketones, ur: NEGATIVE
Leukocytes, UA: NEGATIVE
Total Protein, Urine: NEGATIVE
Urine Glucose: NEGATIVE
pH: 6 (ref 5.0–8.0)

## 2012-10-23 LAB — BASIC METABOLIC PANEL
BUN: 10 mg/dL (ref 6–23)
Chloride: 104 mEq/L (ref 96–112)
Potassium: 4.2 mEq/L (ref 3.5–5.1)

## 2012-10-23 LAB — CBC WITH DIFFERENTIAL/PLATELET
Basophils Relative: 0.4 % (ref 0.0–3.0)
Eosinophils Absolute: 0.1 10*3/uL (ref 0.0–0.7)
Eosinophils Relative: 1.4 % (ref 0.0–5.0)
Lymphocytes Relative: 27.9 % (ref 12.0–46.0)
MCHC: 31.8 g/dL (ref 30.0–36.0)
Neutrophils Relative %: 65.5 % (ref 43.0–77.0)
RBC: 4.31 Mil/uL (ref 3.87–5.11)
WBC: 6.7 10*3/uL (ref 4.5–10.5)

## 2012-10-23 LAB — HEPATIC FUNCTION PANEL
ALT: 19 U/L (ref 0–35)
AST: 24 U/L (ref 0–37)
Total Bilirubin: 0.7 mg/dL (ref 0.3–1.2)

## 2012-10-23 LAB — LIPID PANEL: Cholesterol: 212 mg/dL — ABNORMAL HIGH (ref 0–200)

## 2012-10-23 LAB — TSH: TSH: 2.61 u[IU]/mL (ref 0.35–5.50)

## 2012-10-23 LAB — VITAMIN B12: Vitamin B-12: 1104 pg/mL — ABNORMAL HIGH (ref 211–911)

## 2012-10-23 LAB — HEMOGLOBIN A1C: Hgb A1c MFr Bld: 9.5 % — ABNORMAL HIGH (ref 4.6–6.5)

## 2012-10-25 ENCOUNTER — Other Ambulatory Visit (INDEPENDENT_AMBULATORY_CARE_PROVIDER_SITE_OTHER): Payer: Private Health Insurance - Indemnity

## 2012-10-25 ENCOUNTER — Ambulatory Visit (INDEPENDENT_AMBULATORY_CARE_PROVIDER_SITE_OTHER): Payer: Managed Care, Other (non HMO) | Admitting: Internal Medicine

## 2012-10-25 ENCOUNTER — Encounter: Payer: Self-pay | Admitting: Internal Medicine

## 2012-10-25 VITALS — BP 132/80 | HR 75 | Temp 97.0°F | Ht 63.0 in | Wt 194.0 lb

## 2012-10-25 DIAGNOSIS — D649 Anemia, unspecified: Secondary | ICD-10-CM

## 2012-10-25 DIAGNOSIS — E785 Hyperlipidemia, unspecified: Secondary | ICD-10-CM

## 2012-10-25 DIAGNOSIS — Z0000 Encounter for general adult medical examination without abnormal findings: Secondary | ICD-10-CM

## 2012-10-25 DIAGNOSIS — Z23 Encounter for immunization: Secondary | ICD-10-CM

## 2012-10-25 NOTE — Patient Instructions (Addendum)
Please remember to followup with your GYN for the yearly pap smear and/or mammogram, such as [facility] on [address], or [facility] on [address] had the tetanus shot today Please consider returning next wk for the flu shot (with a nurse visit appointment) Please continue your efforts at being more active, lower cholesterol diet, and weight control. You will be contacted regarding the referral for: hematology, and the Diabetic Education classes Please continue all other medications as before, and refills have been done if requested. Please have the pharmacy call with any other refills you may need. You are otherwise up to date with prevention measures today. Please go to the LAB in the Basement (turn left off the elevator) for the tests to be done today You will be contacted by phone if any changes need to be made immediately.  Otherwise, you will receive a letter about your results with an explanation, but please check with MyChart first.  Please remember to sign up for My Chart if you have not done so, as this will be important to you in the future with finding out test results, communicating by private email, and scheduling acute appointments online when needed.  Please return in 6 months, or sooner if needed, with Lab testing done 3-5 days before

## 2012-10-25 NOTE — Assessment & Plan Note (Signed)
Worsening, for metformin but refuses today, for DM education

## 2012-10-25 NOTE — Assessment & Plan Note (Signed)
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

## 2012-10-25 NOTE — Assessment & Plan Note (Signed)

## 2012-10-25 NOTE — Assessment & Plan Note (Signed)
decliens statin , to cont to work  On lower chol diet

## 2012-10-25 NOTE — Addendum Note (Signed)
Addended by: Scharlene Gloss B on: 10/25/2012 02:04 PM   Modules accepted: Orders

## 2012-10-25 NOTE — Progress Notes (Addendum)
Subjective:    Patient ID: Sarah Berger, female    DOB: 11/15/1950, 62 y.o.   MRN: [id number]  HPI  Here for wellness and f/u;  Overall doing ok;  Pt denies CP, worsening SOB, DOE, wheezing, orthopnea, PND, worsening LE edema, palpitations, dizziness or syncope.  Pt denies neurological change such as new headache, facial or extremity weakness.  Pt denies polydipsia, polyuria, or low sugar symptoms. Pt states overall good compliance with treatment and medications, good tolerability, and has been trying to follow lower cholesterol diet.  Pt denies worsening depressive symptoms, suicidal ideation or panic. No fever, night sweats, wt loss, loss of appetite, or other constitutional symptoms.  Pt states good ability with ADL's, has low fall risk, home safety reviewed and adequate, no other significant changes in hearing or vision, and only occasionally active with exercise.  No overt bleeding or bruising.  Has been trying to follow lower chol diet Past Medical History  Diagnosis Date  . Obesity 06/21/2011  . Impaired glucose tolerance 06/21/2011  . Hyperlipidemia 06/21/2011  . HTN (hypertension) 06/21/2011    Several mild readings in the past, has decline med tx  . Hematochezia 06/21/2011  . Anemia     Hx of   . Hx of colonic polyp 10/08/1996    Colonoscopy-Dr. Sherin Quarry   . Colon cancer   . Anemia, unspecified 10/23/2012   Past Surgical History  Procedure Laterality Date  . Abdominal hysterectomy  1995  . Hemicolectomy  10/2011  . Bone surgery feet  1993    bone spurs    reports that she quit smoking about 28 years ago. She has never used smokeless tobacco. She reports that she does not drink alcohol or use illicit drugs. family history includes Colon polyps in her mother; Hypertension in her mother. There is no history of Colon cancer, Esophageal cancer, Rectal cancer, or Stomach cancer. No Active Allergies Current Outpatient Prescriptions on File Prior to Visit  Medication Sig Dispense Refill  .  Ca Carbonate-Mag Hydroxide 550-110 MG CHEW Chew by mouth.      . cholecalciferol (VITAMIN D) 400 UNITS TABS Take by mouth.      . CHROMIUM PICOLINATE PO Take by mouth every morning.      . fish oil-omega-3 fatty acids 1000 MG capsule Take 1 g by mouth daily.      Marland Kitchen SPIRULINA PO Take by mouth daily.      . vitamin B-12 (CYANOCOBALAMIN) 100 MCG tablet Take 50 mcg by mouth daily.      Marland Kitchen atorvastatin (LIPITOR) 20 MG tablet Take 1 tablet (20 mg total) by mouth daily.  90 tablet  3  . metFORMIN (GLUCOPHAGE) 500 MG tablet Take 1 tablet (500 mg total) by mouth 2 (two) times daily with a meal.  180 tablet  3   No current facility-administered medications on file prior to visit.   Review of Systems  Constitutional: Negative for unexpected weight change, or unusual diaphoresis  HENT: Negative for tinnitus.   Eyes: Negative for photophobia and visual disturbance.  Respiratory: Negative for choking and stridor.   Gastrointestinal: Negative for vomiting and blood in stool.  Genitourinary: Negative for hematuria and decreased urine volume.  Musculoskeletal: Negative for acute joint swelling Skin: Negative for color change and wound.  Neurological: Negative for tremors and numbness other than noted  Psychiatric/Behavioral: Negative for decreased concentration or  hyperactivity.       Objective:   Physical Exam BP 132/80  Pulse 75  Temp(Src) 97 F (  36.1 C) (Oral)  Ht 5\' 3"  (1.6 m)  Wt 194 lb (87.998 kg)  BMI 34.37 kg/m2  SpO2 96% VS noted,  Constitutional: Pt is oriented to person, place, and time. Appears well-developed and well-nourished.  Lavella Lemons: Normocephalic and atraumatic.  Right Ear: External ear normal.  Left Ear: External ear normal.  Nose: Nose normal.  Mouth/Throat: Oropharynx is clear and moist.  Eyes: Conjunctivae and EOM are normal. Pupils are equal, round, and reactive to light.  Neck: Normal range of motion. Neck supple. No JVD present. No tracheal deviation present.   Cardiovascular: Normal rate, regular rhythm, normal heart sounds and intact distal pulses.   Pulmonary/Chest: Effort normal and breath sounds normal.  Abdominal: Soft. Bowel sounds are normal. There is no tenderness. No HSM  Musculoskeletal: Normal range of motion. Exhibits no edema.  Lymphadenopathy:  Has no cervical adenopathy.  Neurological: Pt is alert and oriented to person, place, and time. Pt has normal reflexes. No cranial nerve deficit.  Skin: Skin is warm and dry. No rash noted.  Psychiatric:  Has  normal mood and affect. Behavior is normal.      Assessment & Plan:

## 2012-10-26 ENCOUNTER — Telehealth: Payer: Self-pay | Admitting: Hematology and Oncology

## 2012-10-26 LAB — FERRITIN: Ferritin: 59.9 ng/mL (ref 10.0–291.0)

## 2012-10-26 NOTE — Telephone Encounter (Signed)
LVOM FOR PT TO RETURN CALL IN RE TO REFERRAL.  °

## 2012-10-30 ENCOUNTER — Encounter: Payer: Self-pay | Admitting: Internal Medicine

## 2012-10-30 LAB — HEMOGLOBINOPATHY EVALUATION: Hgb F Quant: 0 % (ref 0.0–2.0)

## 2012-11-01 ENCOUNTER — Telehealth: Payer: Self-pay | Admitting: Hematology and Oncology

## 2012-11-01 ENCOUNTER — Other Ambulatory Visit: Payer: Self-pay

## 2012-11-01 DIAGNOSIS — Z1231 Encounter for screening mammogram for malignant neoplasm of breast: Secondary | ICD-10-CM

## 2012-11-01 NOTE — Telephone Encounter (Signed)
lvom for pt to return call in re to referral.  °

## 2012-11-06 ENCOUNTER — Ambulatory Visit
Admission: RE | Admit: 2012-11-06 | Discharge: 2012-11-06 | Disposition: A | Discharge status: Home / Self Care | Payer: Private Health Insurance - Indemnity | Source: Ambulatory Visit

## 2012-11-06 ENCOUNTER — Encounter: Payer: Private Health Insurance - Indemnity | Attending: Internal Medicine

## 2012-11-06 VITALS — Ht 63.0 in | Wt 192.3 lb

## 2012-11-06 DIAGNOSIS — Z713 Dietary counseling and surveillance: Secondary | ICD-10-CM | POA: Insufficient documentation

## 2012-11-06 DIAGNOSIS — Z1231 Encounter for screening mammogram for malignant neoplasm of breast: Secondary | ICD-10-CM

## 2012-11-06 DIAGNOSIS — E119 Type 2 diabetes mellitus without complications: Secondary | ICD-10-CM | POA: Insufficient documentation

## 2012-11-07 ENCOUNTER — Telehealth: Payer: Self-pay | Admitting: *Deleted

## 2012-11-07 DIAGNOSIS — D649 Anemia, unspecified: Secondary | ICD-10-CM

## 2012-11-07 NOTE — Telephone Encounter (Signed)
Does she mean Dr Myra Rude at the [facility] hematology/oncology?

## 2012-11-07 NOTE — Telephone Encounter (Signed)
Called the patient and she meant the referral to see a hematologist for anemia, Dr. Alecia Lemming.

## 2012-11-07 NOTE — Telephone Encounter (Signed)
The referral from last visit was for DM education, which I still think is a good idea, so that the labs stay normal in the future

## 2012-11-07 NOTE — Telephone Encounter (Signed)
Done to [facility]

## 2012-11-07 NOTE — Telephone Encounter (Signed)
She just remembered Dr. Alecia Lemming

## 2012-11-07 NOTE — Telephone Encounter (Signed)
Pt called states she received a letter stating her labs were normal.  With that being the case requests whether she still needs referral to Dr Alecia Lemming.  Please advise

## 2012-11-08 NOTE — Telephone Encounter (Signed)
Called the patient left detailed message of Md referral

## 2012-11-13 NOTE — Progress Notes (Signed)
Patient was seen on 11/06/12 for the first of a series of three diabetes self-management courses at the [facility].   Current HbA1c: 9.5%  The following learning objectives were met by the patient during this course:   Defines the role of glucose and insulin  Identifies type of diabetes and pathophysiology  Defines the diagnostic criteria for diabetes and prediabetes  States the risk factors for Type 2 Diabetes  States the symptoms of Type 2 Diabetes  Defines Type 2 Diabetes treatment goals  Defines Type 2 Diabetes treatment options  States the rationale for glucose monitoring  Identifies A1C, glucose targets, and testing times  Identifies proper sharps disposal  Defines the purpose of a diabetes food plan  Identifies carbohydrate food groups  Defines effects of carbohydrate foods on glucose levels  Identifies carbohydrate choices/grams/food labels  States benefits of physical activity and effect on glucose  Review of suggested activity guidelines  Handouts given during class include:  Type 2 Diabetes: Basics Book  My Food Plan Book  Food and Activity Log  Your patient has identified their diabetes self-care support plan as:  [facility] support group  Continued diabetes education  Follow-Up Plan: Attend core 2 and core 3

## 2012-11-13 NOTE — Patient Instructions (Signed)
Goals:  Follow Diabetes Meal Plan as instructed  Eat 3 meals and 2 snacks, every 3-5 hrs  Limit carbohydrate intake to 30-45 grams carbohydrate/meal  Limit carbohydrate intake to 0-15 grams carbohydrate/snack  Add lean protein foods to meals/snacks  Monitor glucose levels as instructed by your doctor  Aim for 15-30 mins of physical activity daily  Bring food record and glucose log to your next nutrition visit

## 2012-11-27 NOTE — Progress Notes (Signed)
  Patient was seen on 11/27/12 for the second of a series of three diabetes self-management courses at the [facility]. The following learning objectives were met by the patient during this course:   Explain basic nutrition maintenance and quality assurance  Describe causes, symptoms and treatment of hypoglycemia and hyperglycemia  Explain how to manage diabetes during illness  Describe the importance of good nutrition for health and healthy eating strategies  List strategies to follow meal plan when dining out  Describe the effects of alcohol on glucose and how to use it safely  Describe problem solving skills for day-to-day glucose challenges  Describe strategies to use when treatment plan needs to change  Identify important factors involved in successful weight loss  Describe ways to remain physically active  Describe the impact of regular activity on insulin resistance   Follow-Up Plan: Patient will attend the final class of the ADA Diabetes Self-Care Education.    

## 2012-12-05 ENCOUNTER — Encounter (INDEPENDENT_AMBULATORY_CARE_PROVIDER_SITE_OTHER): Payer: Self-pay

## 2012-12-05 ENCOUNTER — Encounter: Payer: Private Health Insurance - Indemnity | Attending: Internal Medicine

## 2012-12-05 DIAGNOSIS — Z713 Dietary counseling and surveillance: Secondary | ICD-10-CM | POA: Insufficient documentation

## 2012-12-05 DIAGNOSIS — E119 Type 2 diabetes mellitus without complications: Secondary | ICD-10-CM | POA: Insufficient documentation

## 2012-12-05 NOTE — Progress Notes (Signed)
Patient was seen on 12/05/12 for the third of a series of three diabetes self-management courses at the [facility]. The following learning objectives were met by the patient during this course:    Describe how diabetes changes over time   Identify diabetes complications and ways to prevent them   Describe strategies that can promote heart health including lowering blood pressure and cholesterol   Describe strategies to lower dietary fat and sodium in the diet   Identify physical activities that benefit cardiovascular health   Describe role of stress on blood glucose and develop strategies to address psychosocial issues   Evaluate success in meeting personal goal   Describe the belief that they can live successfully with diabetes day to day   Establish 2-3 goals that they will plan to diligently work on until they return for the free 14-month follow-up visit  The following handouts were given in class:  Goal setting handout  Class evaluation form  Low-sodium seasoning tips  Stress management handout  Your patient has established the following 4 month goals for diabetes self-care:  Count carbohydrates at most meals and snacks  Be active 30 minutes or more 3 times a week  Your patient has identified these potential barriers to change:  time  Your patient has identified their diabetes self-care support plan as:  [facility] support group  family   Follow-Up Plan: Patient was offered a 4 month follow-up visit for diabetes self-management education.

## 2013-04-16 ENCOUNTER — Ambulatory Visit: Payer: Private Health Insurance - Indemnity | Admitting: *Deleted

## 2013-05-01 ENCOUNTER — Ambulatory Visit: Payer: Managed Care, Other (non HMO) | Admitting: Internal Medicine

## 2013-09-03 ENCOUNTER — Telehealth: Payer: Self-pay | Admitting: *Deleted

## 2013-09-03 DIAGNOSIS — E1165 Type 2 diabetes mellitus with hyperglycemia: Principal | ICD-10-CM

## 2013-09-03 DIAGNOSIS — IMO0001 Reserved for inherently not codable concepts without codable children: Secondary | ICD-10-CM

## 2013-09-03 NOTE — Telephone Encounter (Signed)
Left message on machine for patient to schedule an appointment to follow up with diabetes. Lipid, a1c, bmet ordered Diabetic bundle

## 2014-01-16 ENCOUNTER — Telehealth: Payer: Self-pay

## 2014-01-16 NOTE — Telephone Encounter (Signed)
A user error has taken place: encounter opened in error, closed for administrative reasons.

## 2014-03-12 ENCOUNTER — Other Ambulatory Visit (INDEPENDENT_AMBULATORY_CARE_PROVIDER_SITE_OTHER): Payer: BLUE CROSS/BLUE SHIELD

## 2014-03-12 DIAGNOSIS — E1165 Type 2 diabetes mellitus with hyperglycemia: Secondary | ICD-10-CM

## 2014-03-12 DIAGNOSIS — IMO0002 Reserved for concepts with insufficient information to code with codable children: Secondary | ICD-10-CM

## 2014-03-12 LAB — BASIC METABOLIC PANEL
BUN: 10 mg/dL (ref 6–23)
CALCIUM: 9.3 mg/dL (ref 8.4–10.5)
CO2: 27 mEq/L (ref 19–32)
CREATININE: 0.79 mg/dL (ref 0.40–1.20)
Chloride: 102 mEq/L (ref 96–112)
GFR: 94.35 mL/min (ref >60.00)
Glucose, Bld: 225 mg/dL — ABNORMAL HIGH (ref 70–99)
POTASSIUM: 4.5 meq/L (ref 3.5–5.1)
Sodium: 136 mEq/L (ref 135–145)

## 2014-03-12 LAB — LIPID PANEL
CHOL/HDL RATIO: 4
Cholesterol: 220 mg/dL — ABNORMAL HIGH (ref 0–200)
HDL: 51 mg/dL (ref >39.00)
LDL CALC: 146 mg/dL — AB (ref 0–99)
NONHDL: 169
TRIGLYCERIDES: 117 mg/dL (ref 0.0–149.0)
VLDL: 23.4 mg/dL (ref 0.0–40.0)

## 2014-03-12 LAB — HEMOGLOBIN A1C: HEMOGLOBIN A1C: 10.3 % — AB (ref 4.6–6.5)

## 2014-03-18 ENCOUNTER — Encounter: Payer: Self-pay | Admitting: Internal Medicine

## 2014-03-18 ENCOUNTER — Ambulatory Visit (INDEPENDENT_AMBULATORY_CARE_PROVIDER_SITE_OTHER): Payer: BLUE CROSS/BLUE SHIELD | Admitting: Internal Medicine

## 2014-03-18 VITALS — BP 164/86 | HR 82 | Temp 98.2°F | Ht 63.0 in | Wt 195.5 lb

## 2014-03-18 DIAGNOSIS — E119 Type 2 diabetes mellitus without complications: Secondary | ICD-10-CM

## 2014-03-18 DIAGNOSIS — Z0000 Encounter for general adult medical examination without abnormal findings: Secondary | ICD-10-CM

## 2014-03-18 DIAGNOSIS — Z23 Encounter for immunization: Secondary | ICD-10-CM

## 2014-03-18 DIAGNOSIS — I10 Essential (primary) hypertension: Secondary | ICD-10-CM

## 2014-03-18 DIAGNOSIS — E785 Hyperlipidemia, unspecified: Secondary | ICD-10-CM

## 2014-03-18 NOTE — Assessment & Plan Note (Signed)
Uncontrolled, refusing med, to cont diet/wt loss, f/u 3 mo

## 2014-03-18 NOTE — Progress Notes (Signed)
Pre visit review using our clinic review tool, if applicable. No additional management support is needed unless otherwise documented below in the visit note. 

## 2014-03-18 NOTE — Assessment & Plan Note (Signed)
Statin intolerant in past, declines further at this time, for lower chol diet  Lab Results  Component Value Date   LDLCALC 146* 03/12/2014

## 2014-03-18 NOTE — Patient Instructions (Addendum)
You had the Pneumovax shot today   Please continue all other medications as before, and refills have been done if requested.  Please have the pharmacy call with any other refills you may need.  Please continue your efforts at being more active, low cholesterol diet, and weight control.  You are otherwise up to date with prevention measures today.  Please keep your appointments with your specialists as you may have planned  Please return in 3 months, or sooner if needed, with Lab testing done 3-5 days before

## 2014-03-18 NOTE — Assessment & Plan Note (Signed)

## 2014-03-18 NOTE — Progress Notes (Signed)
Subjective:    Patient ID: Sarah Berger, female    DOB: 1950-07-13, 64 y.o.   MRN: [id number]  HPI  Here for wellness and f/u;  Overall doing ok;  Pt denies CP, worsening SOB, DOE, wheezing, orthopnea, PND, worsening LE edema, palpitations, dizziness or syncope.  Pt denies neurological change such as new headache, facial or extremity weakness.  Pt denies polydipsia, polyuria, or low sugar symptoms. Pt states overall good compliance with treatment and medications, good tolerability, and has been trying to follow lower cholesterol diet.  Pt denies worsening depressive symptoms, suicidal ideation or panic. No fever, night sweats, wt loss, loss of appetite, or other constitutional symptoms.  Pt states good ability with ADL's, has low fall risk, home safety reviewed and adequate, no other significant changes in hearing or vision, and only occasionally active with exercise.  Pt with a1c > 10 for some time, still absolutely refuses meds as she states she feels well. Never took the metformin. Wants to work on wt loss, re-check in 3 mo Past Medical History  Diagnosis Date  . Obesity 06/21/2011  . Impaired glucose tolerance 06/21/2011  . Hyperlipidemia 06/21/2011  . HTN (hypertension) 06/21/2011    Several mild readings in the past, has decline med tx  . Hematochezia 06/21/2011  . Anemia     Hx of   . Hx of colonic polyp 10/08/1996    Colonoscopy-Dr. Sammuel Cooper   . Colon cancer   . Anemia, unspecified 10/23/2012  . Diabetes mellitus without complication    Past Surgical History  Procedure Laterality Date  . Abdominal hysterectomy  1995  . Hemicolectomy  10/2011  . Bone surgery feet  1993    bone spurs    reports that she quit smoking about 29 years ago. She has never used smokeless tobacco. She reports that she does not drink alcohol or use illicit drugs. family history includes Colon polyps in her mother; Hypertension in her mother. There is no history of Colon cancer, Esophageal cancer, Rectal cancer,  or Stomach cancer. Allergies  Allergen Reactions  . Crestor [Rosuvastatin] Other (See Comments)    myalgia  . Influenza Vaccines   . Lipitor [Atorvastatin] Other (See Comments)    myalgia   Current Outpatient Prescriptions on File Prior to Visit  Medication Sig Dispense Refill  . atorvastatin (LIPITOR) 20 MG tablet Take 1 tablet (20 mg total) by mouth daily. 90 tablet 3  . Ca Carbonate-Mag Hydroxide 550-110 MG CHEW Chew by mouth.    . cholecalciferol (VITAMIN D) 400 UNITS TABS Take by mouth.    . CHROMIUM PICOLINATE PO Take by mouth every morning.    . fish oil-omega-3 fatty acids 1000 MG capsule Take 1 g by mouth daily.    . metFORMIN (GLUCOPHAGE) 500 MG tablet Take 1 tablet (500 mg total) by mouth 2 (two) times daily with a meal. 180 tablet 3  . SPIRULINA PO Take by mouth daily.    . vitamin B-12 (CYANOCOBALAMIN) 100 MCG tablet Take 50 mcg by mouth daily.     No current facility-administered medications on file prior to visit.   Review of Systems Constitutional: Negative for increased diaphoresis, other activity, appetite or other siginficant weight change  HENT: Negative for worsening hearing loss, ear pain, facial swelling, mouth sores and neck stiffness.   Eyes: Negative for other worsening pain, redness or visual disturbance.  Respiratory: Negative for shortness of breath and wheezing.   Cardiovascular: Negative for chest pain and palpitations.  Gastrointestinal: Negative for  diarrhea, blood in stool, abdominal distention or other pain Genitourinary: Negative for hematuria, flank pain or change in urine volume.  Musculoskeletal: Negative for myalgias or other joint complaints.  Skin: Negative for color change and wound.  Neurological: Negative for syncope and numbness. other than noted Hematological: Negative for adenopathy. or other swelling Psychiatric/Behavioral: Negative for hallucinations, self-injury, decreased concentration or other worsening agitation.        Objective:   Physical Exam BP 164/86 mmHg  Pulse 82  Temp(Src) 98.2 F (36.8 C) (Oral)  Ht 5\' 3"  (1.6 m)  Wt 195 lb 8 oz (88.678 kg)  BMI 34.64 kg/m2  SpO2 99% VS noted,  Constitutional: Pt is oriented to person, place, and time. Appears well-developed and well-nourished.  Head: Normocephalic and atraumatic.  Right Ear: External ear normal.  Left Ear: External ear normal.  Nose: Nose normal.  Mouth/Throat: Oropharynx is clear and moist.  Eyes: Conjunctivae and EOM are normal. Pupils are equal, round, and reactive to light.  Neck: Normal range of motion. Neck supple. No JVD present. No tracheal deviation present.  Cardiovascular: Normal rate, regular rhythm, normal heart sounds and intact distal pulses.   Pulmonary/Chest: Effort normal and breath sounds without rales or wheezing  Abdominal: Soft. Bowel sounds are normal. NT. No HSM  Musculoskeletal: Normal range of motion. Exhibits no edema.  Lymphadenopathy:  Has no cervical adenopathy.  Neurological: Pt is alert and oriented to person, place, and time. Pt has normal reflexes. No cranial nerve deficit. Motor grossly intact Skin: Skin is warm and dry. No rash noted.  Psychiatric:  Has normal mood and affect. Behavior is normal.     Assessment & Plan:

## 2014-03-18 NOTE — Assessment & Plan Note (Signed)
BP Readings from Last 3 Encounters:  03/18/14 164/86  10/25/12 132/80  10/10/12 138/78   Refusing BP med today, f/u 3 mo

## 2014-06-20 ENCOUNTER — Ambulatory Visit: Payer: BLUE CROSS/BLUE SHIELD | Admitting: Internal Medicine

## 2014-08-20 ENCOUNTER — Ambulatory Visit: Payer: BLUE CROSS/BLUE SHIELD | Admitting: Internal Medicine

## 2015-03-09 ENCOUNTER — Telehealth: Payer: Self-pay | Admitting: Internal Medicine

## 2015-03-09 DIAGNOSIS — Z0000 Encounter for general adult medical examination without abnormal findings: Secondary | ICD-10-CM

## 2015-03-09 NOTE — Telephone Encounter (Signed)
Labs ordered.

## 2015-03-09 NOTE — Telephone Encounter (Signed)
Pt informed

## 2015-03-25 ENCOUNTER — Encounter: Payer: BLUE CROSS/BLUE SHIELD | Admitting: Internal Medicine

## 2015-03-25 ENCOUNTER — Other Ambulatory Visit (INDEPENDENT_AMBULATORY_CARE_PROVIDER_SITE_OTHER): Payer: BLUE CROSS/BLUE SHIELD

## 2015-03-25 DIAGNOSIS — Z0189 Encounter for other specified special examinations: Secondary | ICD-10-CM | POA: Diagnosis not present

## 2015-03-25 DIAGNOSIS — Z0000 Encounter for general adult medical examination without abnormal findings: Secondary | ICD-10-CM

## 2015-03-25 LAB — HEPATIC FUNCTION PANEL
ALT: 9 U/L (ref 0–35)
AST: 13 U/L (ref 0–37)
Albumin: 3.9 g/dL (ref 3.5–5.2)
Alkaline Phosphatase: 69 U/L (ref 39–117)
BILIRUBIN DIRECT: 0.1 mg/dL (ref 0.0–0.3)
BILIRUBIN TOTAL: 0.6 mg/dL (ref 0.2–1.2)
Total Protein: 7.3 g/dL (ref 6.0–8.3)

## 2015-03-25 LAB — CBC WITH DIFFERENTIAL/PLATELET
BASOS PCT: 0.4 % (ref 0.0–3.0)
Basophils Absolute: 0 10*3/uL (ref 0.0–0.1)
EOS PCT: 1.9 % (ref 0.0–5.0)
Eosinophils Absolute: 0.1 10*3/uL (ref 0.0–0.7)
HEMATOCRIT: 35.8 % — AB (ref 36.0–46.0)
HEMOGLOBIN: 11.3 g/dL — AB (ref 12.0–15.0)
LYMPHS PCT: 25.8 % (ref 12.0–46.0)
Lymphs Abs: 1.8 10*3/uL (ref 0.7–4.0)
MCHC: 31.6 g/dL (ref 30.0–36.0)
MCV: 71.8 fl — AB (ref 78.0–100.0)
Monocytes Absolute: 0.4 10*3/uL (ref 0.1–1.0)
Monocytes Relative: 5.8 % (ref 3.0–12.0)
Neutro Abs: 4.7 10*3/uL (ref 1.4–7.7)
Neutrophils Relative %: 66.1 % (ref 43.0–77.0)
Platelets: 238 10*3/uL (ref 150.0–400.0)
RBC: 4.99 Mil/uL (ref 3.87–5.11)
RDW: 15.3 % (ref 11.5–15.5)
WBC: 7.1 10*3/uL (ref 4.0–10.5)

## 2015-03-25 LAB — BASIC METABOLIC PANEL
BUN: 8 mg/dL (ref 6–23)
CO2: 26 mEq/L (ref 19–32)
CREATININE: 0.77 mg/dL (ref 0.40–1.20)
Calcium: 9 mg/dL (ref 8.4–10.5)
Chloride: 101 mEq/L (ref 96–112)
GFR: 96.87 mL/min (ref >60.00)
Glucose, Bld: 315 mg/dL — ABNORMAL HIGH (ref 70–99)
POTASSIUM: 4.1 meq/L (ref 3.5–5.1)
Sodium: 135 mEq/L (ref 135–145)

## 2015-03-25 LAB — TSH: TSH: 2.04 u[IU]/mL (ref 0.35–4.50)

## 2015-03-25 LAB — URINALYSIS, ROUTINE W REFLEX MICROSCOPIC
Bilirubin Urine: NEGATIVE
Ketones, ur: NEGATIVE
Leukocytes, UA: NEGATIVE
NITRITE: NEGATIVE
Specific Gravity, Urine: >=1.03 — AB (ref 1.000–1.030)
Total Protein, Urine: NEGATIVE
Urobilinogen, UA: 0.2 (ref 0.0–1.0)
pH: 5.5 (ref 5.0–8.0)

## 2015-03-25 LAB — LIPID PANEL
CHOL/HDL RATIO: 4
Cholesterol: 255 mg/dL — ABNORMAL HIGH (ref 0–200)
HDL: 57.6 mg/dL (ref >39.00)
LDL CALC: 178 mg/dL — AB (ref 0–99)
NONHDL: 197.07
TRIGLYCERIDES: 96 mg/dL (ref 0.0–149.0)
VLDL: 19.2 mg/dL (ref 0.0–40.0)

## 2015-03-25 LAB — VITAMIN B12: VITAMIN B 12: 897 pg/mL (ref 211–911)

## 2015-03-25 LAB — IRON: IRON: 64 ug/dL (ref 42–145)

## 2015-03-26 ENCOUNTER — Encounter: Payer: Self-pay | Admitting: Internal Medicine

## 2015-03-26 ENCOUNTER — Ambulatory Visit (INDEPENDENT_AMBULATORY_CARE_PROVIDER_SITE_OTHER): Payer: BLUE CROSS/BLUE SHIELD | Admitting: Internal Medicine

## 2015-03-26 ENCOUNTER — Other Ambulatory Visit (INDEPENDENT_AMBULATORY_CARE_PROVIDER_SITE_OTHER): Payer: BLUE CROSS/BLUE SHIELD

## 2015-03-26 VITALS — BP 122/72 | HR 97 | Temp 98.4°F | Resp 20 | Wt 186.0 lb

## 2015-03-26 DIAGNOSIS — E119 Type 2 diabetes mellitus without complications: Secondary | ICD-10-CM

## 2015-03-26 DIAGNOSIS — M8580 Other specified disorders of bone density and structure, unspecified site: Secondary | ICD-10-CM

## 2015-03-26 DIAGNOSIS — Z0000 Encounter for general adult medical examination without abnormal findings: Secondary | ICD-10-CM

## 2015-03-26 DIAGNOSIS — I10 Essential (primary) hypertension: Secondary | ICD-10-CM

## 2015-03-26 DIAGNOSIS — E785 Hyperlipidemia, unspecified: Secondary | ICD-10-CM | POA: Diagnosis not present

## 2015-03-26 LAB — HEMOGLOBIN A1C: HEMOGLOBIN A1C: 13.8 % — AB (ref 4.6–6.5)

## 2015-03-26 MED ORDER — LISINOPRIL 5 MG PO TABS: 5.0000 mg | ORAL_TABLET | Freq: Every day | ORAL | Status: DC

## 2015-03-26 MED ORDER — ASPIRIN EC 81 MG PO TBEC: 81.0000 mg | DELAYED_RELEASE_TABLET | Freq: Every day | ORAL | Status: DC

## 2015-03-26 NOTE — Patient Instructions (Addendum)
You will most likely receive a letter from Dr Hilarie Fredrickson of Gastroenterology regarding being due for follow up Colonoscopy about Aug 2017  Please schedule the bone density test before leaving today at the scheduling desk (where you check out)  Please take all new medication as prescribed - the low dose lisinopril 5 mg per day, to help protect the kidney from damage from the diabetes  Please continue all other medications as before, and refills have been done if requested.  Please have the pharmacy call with any other refills you may need.  Please continue your efforts at being more active, low cholesterol diet, and weight control.  You are otherwise up to date with prevention measures today.  Please keep your appointments with your specialists as you may have planned  We will try to addon your Hgba1c today, as this was not done with your blood work  Please return in 6 months, or sooner if needed, with Lab testing done 3-5 days before

## 2015-03-26 NOTE — Assessment & Plan Note (Signed)
Has been statin intolerant subjectively, will not try further , for lower chol diet

## 2015-03-26 NOTE — Assessment & Plan Note (Signed)

## 2015-03-26 NOTE — Progress Notes (Signed)
Subjective:    Patient ID: Sarah Berger, female    DOB: December 22, 1950, 65 y.o.   MRN: [id number]  HPI  Here for wellness and f/u;  Overall doing ok;  Pt denies Chest pain, worsening SOB, DOE, wheezing, orthopnea, PND, worsening LE edema, palpitations, dizziness or syncope.  Pt denies neurological change such as new headache, facial or extremity weakness.  Pt denies polydipsia, polyuria, or low sugar symptoms. Pt states overall good compliance with treatment and medications, good tolerability, and has been trying to follow appropriate diet.  Pt denies worsening depressive symptoms, suicidal ideation or panic. No fever, night sweats, wt loss, loss of appetite, or other constitutional symptoms.  Pt states good ability with ADL's, has low fall risk, home safety reviewed and adequate, no other significant changes in hearing or vision, and only occasionally active with exercise.  Hads been trying to work on lower calorie diet, and lost several lbs Wt Readings from Last 3 Encounters:  03/26/15 186 lb (84.369 kg)  03/18/14 195 lb 8 oz (88.678 kg)  11/13/12 192 lb 4.8 oz (87.227 kg)  Not ready to retire, wants to keep on working. Due for DXA.  Has been intolerant of crestor and lipitor in past/  Not taking the ACE as well.   Past Medical History  Diagnosis Date  . Obesity 06/21/2011  . Impaired glucose tolerance 06/21/2011  . Hyperlipidemia 06/21/2011  . HTN (hypertension) 06/21/2011    Several mild readings in the past, has decline med tx  . Hematochezia 06/21/2011  . Anemia     Hx of   . Hx of colonic polyp 10/08/1996    Colonoscopy-Dr. Sammuel Cooper   . Colon cancer ([place])   . Anemia, unspecified 10/23/2012  . Diabetes mellitus without complication [facility])    Past Surgical History  Procedure Laterality Date  . Abdominal hysterectomy  1995  . Hemicolectomy  10/2011  . Bone surgery feet  1993    bone spurs    reports that she quit smoking about 30 years ago. She has never used smokeless tobacco. She reports  that she does not drink alcohol or use illicit drugs. family history includes Colon polyps in her mother; Hypertension in her mother. There is no history of Colon cancer, Esophageal cancer, Rectal cancer, or Stomach cancer. Allergies  Allergen Reactions  . Crestor [Rosuvastatin] Other (See Comments)    myalgia  . Influenza Vaccines   . Lipitor [Atorvastatin] Other (See Comments)    myalgia   Current Outpatient Prescriptions on File Prior to Visit  Medication Sig Dispense Refill  . Ca Carbonate-Mag Hydroxide 550-110 MG CHEW Chew by mouth.    . cholecalciferol (VITAMIN D) 400 UNITS TABS Take by mouth.    . CHROMIUM PICOLINATE PO Take by mouth every morning.    . fish oil-omega-3 fatty acids 1000 MG capsule Take 1 g by mouth daily.    Marland Kitchen SPIRULINA PO Take by mouth daily.    . vitamin B-12 (CYANOCOBALAMIN) 100 MCG tablet Take 50 mcg by mouth daily.     No current facility-administered medications on file prior to visit.    Review of Systems Constitutional: Negative for increased diaphoresis, other activity, appetite or siginficant weight change other than noted HENT: Negative for worsening hearing loss, ear pain, facial swelling, mouth sores and neck stiffness.   Eyes: Negative for other worsening pain, redness or visual disturbance.  Respiratory: Negative for shortness of breath and wheezing  Cardiovascular: Negative for chest pain and palpitations.  Gastrointestinal: Negative for  diarrhea, blood in stool, abdominal distention or other pain Genitourinary: Negative for hematuria, flank pain or change in urine volume.  Musculoskeletal: Negative for myalgias or other joint complaints.  Skin: Negative for color change and wound or drainage.  Neurological: Negative for syncope and numbness. other than noted Hematological: Negative for adenopathy. or other swelling Psychiatric/Behavioral: Negative for hallucinations, SI, self-injury, decreased concentration or other worsening agitation.       Objective:   Physical Exam BP 122/72 mmHg  Pulse 97  Temp(Src) 98.4 F (36.9 C) (Oral)  Resp 20  Wt 186 lb (84.369 kg)  SpO2 95% VS noted,  Constitutional: Pt is oriented to person, place, and time. Appears well-developed and well-nourished, in no significant distress Head: Normocephalic and atraumatic.  Right Ear: External ear normal.  Left Ear: External ear normal.  Nose: Nose normal.  Mouth/Throat: Oropharynx is clear and moist.  Eyes: Conjunctivae and EOM are normal. Pupils are equal, round, and reactive to light.  Neck: Normal range of motion. Neck supple. No JVD present. No tracheal deviation present or significant neck LA or mass Cardiovascular: Normal rate, regular rhythm, normal heart sounds and intact distal pulses.   Pulmonary/Chest: Effort normal and breath sounds without rales or wheezing  Abdominal: Soft. Bowel sounds are normal. NT. No HSM  Musculoskeletal: Normal range of motion. Exhibits no edema.  Lymphadenopathy:  Has no cervical adenopathy.  Neurological: Pt is alert and oriented to person, place, and time. Pt has normal reflexes. No cranial nerve deficit. Motor grossly intact Skin: Skin is warm and dry. No rash noted.  Psychiatric:  Has normal mood and affect. Behavior is normal.     Assessment & Plan:

## 2015-03-26 NOTE — Progress Notes (Signed)
Pre visit review using our clinic review tool, if applicable. No additional management support is needed unless otherwise documented below in the visit note. 

## 2015-03-26 NOTE — Assessment & Plan Note (Signed)
To add lisniopril 5 mg for renoprotective,  to f/u any worsening symptoms or concerns

## 2015-03-26 NOTE — Assessment & Plan Note (Signed)
a1c not done with labs, will try to addon, furhter tx pending results. Lab Results  Component Value Date   HGBA1C 10.3* 03/12/2014

## 2015-03-27 ENCOUNTER — Other Ambulatory Visit: Payer: Self-pay | Admitting: Internal Medicine

## 2015-03-27 ENCOUNTER — Encounter: Payer: Self-pay | Admitting: Internal Medicine

## 2015-03-27 DIAGNOSIS — E119 Type 2 diabetes mellitus without complications: Secondary | ICD-10-CM

## 2015-03-27 MED ORDER — GLIPIZIDE ER 10 MG PO TB24: 10.0000 mg | ORAL_TABLET | Freq: Every day | ORAL | Status: DC

## 2015-03-27 MED ORDER — METFORMIN HCL ER 500 MG PO TB24: ORAL_TABLET | ORAL | Status: DC

## 2015-09-16 ENCOUNTER — Encounter: Payer: Self-pay | Admitting: Internal Medicine

## 2015-09-25 ENCOUNTER — Ambulatory Visit: Payer: BLUE CROSS/BLUE SHIELD | Admitting: Internal Medicine

## 2017-08-04 ENCOUNTER — Ambulatory Visit (INDEPENDENT_AMBULATORY_CARE_PROVIDER_SITE_OTHER): Payer: BLUE CROSS/BLUE SHIELD | Admitting: Internal Medicine

## 2017-08-04 ENCOUNTER — Encounter: Payer: Self-pay | Admitting: Internal Medicine

## 2017-08-04 ENCOUNTER — Other Ambulatory Visit (INDEPENDENT_AMBULATORY_CARE_PROVIDER_SITE_OTHER): Payer: BLUE CROSS/BLUE SHIELD

## 2017-08-04 VITALS — BP 140/82 | HR 78 | Ht 63.0 in | Wt 163.0 lb

## 2017-08-04 DIAGNOSIS — Z85038 Personal history of other malignant neoplasm of large intestine: Secondary | ICD-10-CM

## 2017-08-04 DIAGNOSIS — E2839 Other primary ovarian failure: Secondary | ICD-10-CM

## 2017-08-04 DIAGNOSIS — I10 Essential (primary) hypertension: Secondary | ICD-10-CM | POA: Diagnosis not present

## 2017-08-04 DIAGNOSIS — K635 Polyp of colon: Secondary | ICD-10-CM

## 2017-08-04 DIAGNOSIS — E559 Vitamin D deficiency, unspecified: Secondary | ICD-10-CM

## 2017-08-04 DIAGNOSIS — Z0000 Encounter for general adult medical examination without abnormal findings: Secondary | ICD-10-CM | POA: Diagnosis not present

## 2017-08-04 DIAGNOSIS — Z23 Encounter for immunization: Secondary | ICD-10-CM | POA: Diagnosis not present

## 2017-08-04 DIAGNOSIS — E119 Type 2 diabetes mellitus without complications: Secondary | ICD-10-CM

## 2017-08-04 LAB — CBC WITH DIFFERENTIAL/PLATELET
BASOS ABS: 0 10*3/uL (ref 0.0–0.1)
Basophils Relative: 0.3 % (ref 0.0–3.0)
EOS ABS: 0 10*3/uL (ref 0.0–0.7)
Eosinophils Relative: 0.5 % (ref 0.0–5.0)
HEMATOCRIT: 36.7 % (ref 36.0–46.0)
HEMOGLOBIN: 11.7 g/dL — AB (ref 12.0–15.0)
LYMPHS PCT: 24.8 % (ref 12.0–46.0)
Lymphs Abs: 1.9 10*3/uL (ref 0.7–4.0)
MCHC: 31.8 g/dL (ref 30.0–36.0)
MCV: 72.5 fl — ABNORMAL LOW (ref 78.0–100.0)
Monocytes Absolute: 0.5 10*3/uL (ref 0.1–1.0)
Monocytes Relative: 6.5 % (ref 3.0–12.0)
Neutro Abs: 5.3 10*3/uL (ref 1.4–7.7)
Neutrophils Relative %: 67.9 % (ref 43.0–77.0)
Platelets: 237 10*3/uL (ref 150.0–400.0)
RBC: 5.06 Mil/uL (ref 3.87–5.11)
RDW: 14.7 % (ref 11.5–15.5)
WBC: 7.8 10*3/uL (ref 4.0–10.5)

## 2017-08-04 LAB — URINALYSIS, ROUTINE W REFLEX MICROSCOPIC
Bilirubin Urine: NEGATIVE
HGB URINE DIPSTICK: NEGATIVE
Ketones, ur: NEGATIVE
Leukocytes, UA: NEGATIVE
Nitrite: NEGATIVE
RBC / HPF: NONE SEEN (ref 0–?)
Specific Gravity, Urine: 1.025 (ref 1.000–1.030)
TOTAL PROTEIN, URINE-UPE24: NEGATIVE
Urine Glucose: >=1000 — AB
Urobilinogen, UA: 0.2 (ref 0.0–1.0)
pH: 5.5 (ref 5.0–8.0)

## 2017-08-04 LAB — BASIC METABOLIC PANEL
BUN: 9 mg/dL (ref 6–23)
CO2: 27 mEq/L (ref 19–32)
CREATININE: 0.67 mg/dL (ref 0.40–1.20)
Calcium: 9.6 mg/dL (ref 8.4–10.5)
Chloride: 100 mEq/L (ref 96–112)
GFR: 112.91 mL/min (ref >60.00)
GLUCOSE: 340 mg/dL — AB (ref 70–99)
Potassium: 3.9 mEq/L (ref 3.5–5.1)
Sodium: 136 mEq/L (ref 135–145)

## 2017-08-04 LAB — LIPID PANEL
Cholesterol: 272 mg/dL — ABNORMAL HIGH (ref 0–200)
HDL: 57.7 mg/dL (ref >39.00)
LDL Cholesterol: 185 mg/dL — ABNORMAL HIGH (ref 0–99)
NONHDL: 214.15
TRIGLYCERIDES: 144 mg/dL (ref 0.0–149.0)
Total CHOL/HDL Ratio: 5
VLDL: 28.8 mg/dL (ref 0.0–40.0)

## 2017-08-04 LAB — HEPATIC FUNCTION PANEL
ALK PHOS: 93 U/L (ref 39–117)
ALT: 12 U/L (ref 0–35)
AST: 11 U/L (ref 0–37)
Albumin: 4.1 g/dL (ref 3.5–5.2)
BILIRUBIN DIRECT: 0.1 mg/dL (ref 0.0–0.3)
BILIRUBIN TOTAL: 0.5 mg/dL (ref 0.2–1.2)
TOTAL PROTEIN: 7.5 g/dL (ref 6.0–8.3)

## 2017-08-04 LAB — HEMOGLOBIN A1C: Hgb A1c MFr Bld: 16.1 % — ABNORMAL HIGH (ref 4.6–6.5)

## 2017-08-04 LAB — MICROALBUMIN / CREATININE URINE RATIO
CREATININE, U: 73.9 mg/dL
MICROALB/CREAT RATIO: 2.7 mg/g (ref 0.0–30.0)
Microalb, Ur: 2 mg/dL — ABNORMAL HIGH (ref 0.0–1.9)

## 2017-08-04 LAB — VITAMIN D 25 HYDROXY (VIT D DEFICIENCY, FRACTURES): VITD: 28.64 ng/mL — AB (ref 30.00–100.00)

## 2017-08-04 LAB — TSH: TSH: 1.49 u[IU]/mL (ref 0.35–4.50)

## 2017-08-04 MED ORDER — CLOTRIMAZOLE-BETAMETHASONE 1-0.05 % EX CREA: TOPICAL_CREAM | CUTANEOUS | 1 refills | Status: DC

## 2017-08-04 NOTE — Assessment & Plan Note (Signed)
For colonoscopy as she is due ?

## 2017-08-04 NOTE — Progress Notes (Signed)
Subjective:    Patient ID: Sarah Berger, female    DOB: Jul 10, 1950, 67 y.o.   MRN: [id number]  HPI  Here for wellness and f/u after lost to f/u since 2017;  Overall doing ok;  Pt denies Chest pain, worsening SOB, DOE, wheezing, orthopnea, PND, worsening LE edema, palpitations, dizziness or syncope.  Pt denies neurological change such as new headache, facial or extremity weakness.  Pt denies polydipsia, polyuria, or low sugar symptoms. Pt states overall good compliance with treatment and medications, good tolerability, and has been trying to follow appropriate diet.  Pt denies worsening depressive symptoms, suicidal ideation or panic. No fever, night sweats, wt loss, loss of appetite, or other constitutional symptoms.  Pt states good ability with ADL's, has low fall risk, home safety reviewed and adequate, no other significant changes in hearing or vision, and only occasionally active with exercise. Lost 30 lbs overall with better diet per pt but has not been checking sugars.   Wt Readings from Last 3 Encounters:  08/04/17 163 lb (73.9 kg)  03/26/15 186 lb (84.4 kg)  03/18/14 195 lb 8 oz (88.7 kg)  Plans to make appt for mammogram  Also has a mild groin rash she thinks is related to fabric softner use on the undergarments.   Past Medical History:  Diagnosis Date  . Anemia    Hx of   . Anemia, unspecified 10/23/2012  . Colon cancer ([place])   . Diabetes mellitus without complication ([place])   . Hematochezia 06/21/2011  . HTN (hypertension) 06/21/2011   Several mild readings in the past, has decline med tx  . Hx of colonic polyp 10/08/1996   Colonoscopy-Dr. Sammuel Cooper   . Hyperlipidemia 06/21/2011  . Impaired glucose tolerance 06/21/2011  . Obesity 06/21/2011   Past Surgical History:  Procedure Laterality Date  . ABDOMINAL HYSTERECTOMY  1995  . bone surgery feet  1993   bone spurs  . HEMICOLECTOMY  10/2011    reports that she quit smoking about 32 years ago. She has never used smokeless tobacco.  She reports that she does not drink alcohol or use drugs. family history includes Colon polyps in her mother; Hypertension in her mother. Allergies  Allergen Reactions  . Crestor [Rosuvastatin] Other (See Comments)    myalgia  . Influenza Vaccines   . Lipitor [Atorvastatin] Other (See Comments)    myalgia   Current Outpatient Medications on File Prior to Visit  Medication Sig Dispense Refill  . Astaxanthin 4 MG CAPS Take by mouth daily.    . Biotin 10 MG CAPS Take by mouth daily.    . Ca Carbonate-Mag Hydroxide 550-110 MG CHEW Chew by mouth.    . cholecalciferol (VITAMIN D) 400 UNITS TABS Take by mouth.    . Cinnamon 500 MG capsule Take by mouth daily.    Marland Kitchen Fe Bisgly-Vit C-Vit B12-FA (GENTLE IRON PO) Take by mouth daily.    . fish oil-omega-3 fatty acids 1000 MG capsule Take 1 g by mouth daily.    Javier Docker Oil 1000 MG CAPS Take by mouth daily.    Marland Kitchen SPIRULINA PO Take by mouth daily.    . vitamin B-12 (CYANOCOBALAMIN) 100 MCG tablet Take 50 mcg by mouth daily.    Marland Kitchen aspirin EC 81 MG tablet Take 1 tablet (81 mg total) by mouth daily. (Patient not taking: Reported on 08/04/2017) 90 tablet 11  . CHROMIUM PICOLINATE PO Take by mouth every morning.    Marland Kitchen glipiZIDE (GLUCOTROL XL) 10 MG 24  hr tablet Take 1 tablet (10 mg total) by mouth daily with breakfast. (Patient not taking: Reported on 08/04/2017) 90 tablet 3  . lisinopril (PRINIVIL,ZESTRIL) 5 MG tablet Take 1 tablet (5 mg total) by mouth daily. (Patient not taking: Reported on 08/04/2017) 90 tablet 3  . metFORMIN (GLUCOPHAGE-XR) 500 MG 24 hr tablet 4 tabs by mouth in the AM (Patient not taking: Reported on 08/04/2017) 120 tablet 11   No current facility-administered medications on file prior to visit.    Review of Systems VS noted,  Constitutional: Pt is oriented to person, place, and time. Appears well-developed and well-nourished, in no significant distress and comfortable Head: Normocephalic and atraumatic  Eyes: Conjunctivae and EOM are  normal. Pupils are equal, round, and reactive to light Right Ear: External ear normal without discharge Left Ear: External ear normal without discharge Nose: Nose without discharge or deformity Mouth/Throat: Oropharynx is without other ulcerations and moist  Neck: Normal range of motion. Neck supple. No JVD present. No tracheal deviation present or significant neck LA or mass Cardiovascular: Normal rate, regular rhythm, normal heart sounds and intact distal pulses.   Pulmonary/Chest: WOB normal and breath sounds without rales or wheezing  Abdominal: Soft. Bowel sounds are normal. NT. No HSM  Musculoskeletal: Normal range of motion. Exhibits no edema Lymphadenopathy: Has no other cervical adenopathy.  Neurological: Pt is alert and oriented to person, place, and time. Pt has normal reflexes. No cranial nerve deficit. Motor grossly intact, Gait intact Skin: Skin is warm and dry. No rash noted or new ulcerations Psychiatric:  Has normal mood and affect. Behavior is normal without agitation All other system neg per pt    Objective:   Physical Exam BP 140/82 (BP Location: Left Arm, Patient Position: Sitting, Cuff Size: Normal)   Pulse 78   Ht 5\' 3"  (1.6 m)   Wt 163 lb (73.9 kg)   SpO2 98%   BMI 28.87 kg/m  VS noted,  Constitutional: Pt is oriented to person, place, and time. Appears well-developed and well-nourished, in no significant distress and comfortable Head: Normocephalic and atraumatic  Eyes: Conjunctivae and EOM are normal. Pupils are equal, round, and reactive to light Right Ear: External ear normal without discharge Left Ear: External ear normal without discharge Nose: Nose without discharge or deformity Mouth/Throat: Oropharynx is without other ulcerations and moist  Neck: Normal range of motion. Neck supple. No JVD present. No tracheal deviation present or significant neck LA or mass Cardiovascular: Normal rate, regular rhythm, normal heart sounds and intact distal pulses.     Pulmonary/Chest: WOB normal and breath sounds without rales or wheezing  Abdominal: Soft. Bowel sounds are normal. NT. No HSM  Musculoskeletal: Normal range of motion. Exhibits no edema Lymphadenopathy: Has no other cervical adenopathy.  Neurological: Pt is alert and oriented to person, place, and time. Pt has normal reflexes. No cranial nerve deficit. Motor grossly intact, Gait intact Skin: Skin is warm and dry. No rash noted or new ulcerations Psychiatric:  Has normal mood and affect. Behavior is normal without agitation No other exam findings    Assessment & Plan:

## 2017-08-04 NOTE — Patient Instructions (Addendum)
You had the Prevnar 13 pneumonia shot today  Please schedule the bone density test before leaving today at the scheduling desk (where you check out)  You will be contacted regarding the referral for: colonoscopy  Please take all new medication as prescribed  - the cream  Please continue all other medications as before, and refills have been done if requested.  Please have the pharmacy call with any other refills you may need.  Please continue your efforts at being more active, low cholesterol diet, and weight control.  You are otherwise up to date with prevention measures today.  Please keep your appointments with your specialists as you may have planned  Please go to the LAB in the Basement (turn left off the elevator) for the tests to be done today  You will be contacted by phone if any changes need to be made immediately.  Otherwise, you will receive a letter about your results with an explanation, but please check with MyChart first.  Please remember to sign up for MyChart if you have not done so, as this will be important to you in the future with finding out test results, communicating by private email, and scheduling acute appointments online when needed.  Please return in 6 months, or sooner if needed, with Lab testing done 3-5 days before

## 2017-08-04 NOTE — Assessment & Plan Note (Signed)

## 2017-08-04 NOTE — Assessment & Plan Note (Signed)
stable overall by history and exam, recent data reviewed with pt, and pt to continue medical treatment as before,  to f/u any worsening symptoms or concerns le BP Readings from Last 3 Encounters:  08/04/17 140/82  03/26/15 122/72  03/18/14 (!) 164/86

## 2017-08-04 NOTE — Assessment & Plan Note (Signed)
Control unclear, o/w stable overall by history and exam, 2017 recent data reviewed with pt, and pt to continue medical treatment as before,  to f/u any worsening symptoms or concerns

## 2017-08-06 ENCOUNTER — Other Ambulatory Visit: Payer: Self-pay | Admitting: Internal Medicine

## 2017-08-06 ENCOUNTER — Encounter: Payer: Self-pay | Admitting: Internal Medicine

## 2017-08-06 DIAGNOSIS — E119 Type 2 diabetes mellitus without complications: Secondary | ICD-10-CM

## 2017-08-07 ENCOUNTER — Telehealth: Payer: Self-pay

## 2017-08-07 NOTE — Telephone Encounter (Signed)
-----   Message from Biagio Borg, MD sent at 08/06/2017  2:04 PM EDT ----- Letter sent, cont same tx except  The test results show that your current treatment is OK, except the Vit D level is low, and the A1c (for blood sugar) is Extremely high.  Please restart your medications as discussed at your office visit, but we need to refer you to Endocrinology asap to get better control of the sugar, or you will have high risk for complications such as nerve, kidney, heart and eye damage soon.  You should hear from the office soon  Sarah Berger to please inform pt, I will do referral

## 2017-08-07 NOTE — Telephone Encounter (Signed)
Patient has been informed and expressed understanding.  

## 2017-08-11 ENCOUNTER — Ambulatory Visit (INDEPENDENT_AMBULATORY_CARE_PROVIDER_SITE_OTHER)
Admission: RE | Admit: 2017-08-11 | Discharge: 2017-08-11 | Disposition: A | Discharge status: Home / Self Care | Payer: BLUE CROSS/BLUE SHIELD | Source: Ambulatory Visit | Attending: Internal Medicine | Admitting: Internal Medicine

## 2017-08-11 DIAGNOSIS — E2839 Other primary ovarian failure: Secondary | ICD-10-CM | POA: Diagnosis not present

## 2017-08-13 DIAGNOSIS — E2839 Other primary ovarian failure: Secondary | ICD-10-CM | POA: Diagnosis not present

## 2017-08-14 ENCOUNTER — Encounter: Payer: Self-pay | Admitting: Internal Medicine

## 2017-08-16 ENCOUNTER — Encounter: Payer: Self-pay | Admitting: Internal Medicine

## 2017-09-05 NOTE — Progress Notes (Signed)
67 y.o. No obstetric history on file. DivorcedAfrican AmericanF here for annual exam.  Reports ongoing vaginal itching. The itching started a month ago when she tried a new Pharmacologist. Primary gave her a steroid/antifungal cream which didn't help. She has tried vagasil and monistat and it hasn't helped. The itching is diffuse on her vulva and perianal region. The itching can wake her up at night.  No vaginal bleeding, slight yellow discharge, no odor.  Not sexually active.  The patient has a h/o uncontrolled diabetes, HgbA1C from last month was 16.1. The patient denies that she has diabetes and doesn't want to be on medication.  She has a h/o colon cancer.    Patient's last menstrual period was 03/01/1987 (approximate).          Sexually active: No.  The current method of family planning hysterectomy. Exercising: Yes.    walking Smoker:  no  Health Maintenance: Pap:  03/01/1987 WNL per patient History of abnormal Pap:  no MMG:  03/01/2015 WNL per patient Colonoscopy:  02/29/2012 WNL, scheduled for 09/2017 BMD:   08/11/2017 WNL per patient TDaP:  10/25/2012 Gardasil: N/A   reports that she quit smoking about 32 years ago. She has never used smokeless tobacco. She reports that she does not drink alcohol or use drugs. She is a Agricultural consultant.   Past Medical History:  Diagnosis Date  . Anemia    Hx of   . Anemia, unspecified 10/23/2012  . Colon cancer ([place])   . Diabetes mellitus without complication ([place])   . Hematochezia 06/21/2011  . HTN (hypertension) 06/21/2011   Several mild readings in the past, has decline med tx  . Hx of colonic polyp 10/08/1996   Colonoscopy-Dr. Sammuel Cooper   . Hyperlipidemia 06/21/2011  . Impaired glucose tolerance 06/21/2011  . Obesity 06/21/2011    Past Surgical History:  Procedure Laterality Date  . ABDOMINAL HYSTERECTOMY  1995  . bone surgery feet  1993   bone spurs  . HEMICOLECTOMY  10/2011    Current Outpatient Medications  Medication Sig Dispense  Refill  . Astaxanthin 4 MG CAPS Take by mouth daily.    . Biotin 10 MG CAPS Take by mouth daily.    . Ca Carbonate-Mag Hydroxide 550-110 MG CHEW Chew by mouth.    . cholecalciferol (VITAMIN D) 400 UNITS TABS Take by mouth.    . CHROMIUM PICOLINATE PO Take by mouth every morning.    . Cinnamon 500 MG capsule Take by mouth daily.    Marland Kitchen Fe Bisgly-Vit C-Vit B12-FA (GENTLE IRON PO) Take by mouth daily.    . fish oil-omega-3 fatty acids 1000 MG capsule Take 1 g by mouth daily.    Javier Docker Oil 1000 MG CAPS Take by mouth daily.    Marland Kitchen SPIRULINA PO Take by mouth daily.    . vitamin B-12 (CYANOCOBALAMIN) 100 MCG tablet Take 50 mcg by mouth daily.    . clotrimazole-betamethasone (LOTRISONE) cream Use as directed twice per day as needed to affeted area (Patient not taking: Reported on 09/06/2017) 15 g 1   No current facility-administered medications for this visit.     Family History  Problem Relation Age of Onset  . Hypertension Mother   . Colon polyps Mother   . Colon cancer Neg Hx   . Esophageal cancer Neg Hx   . Rectal cancer Neg Hx   . Stomach cancer Neg Hx     Review of Systems  Constitutional: Negative.   HENT: Negative.  Eyes: Negative.   Respiratory: Negative.   Cardiovascular: Negative.   Gastrointestinal: Negative.   Endocrine: Negative.   Genitourinary:       Vaginal itching  Musculoskeletal: Negative.   Skin: Negative.   Allergic/Immunologic: Negative.   Neurological: Negative.   Hematological: Negative.   Psychiatric/Behavioral: Negative.   No GSI, occasional urinary urgency, no leakage.   Exam:   BP 127/82 (BP Location: Right Arm, Patient Position: Sitting)   Pulse 64   Ht 5\' 3"  (1.6 m)   Wt 158 lb 12.8 oz (72 kg)   LMP 03/01/1987 (Approximate)   BMI 28.13 kg/m   Weight change: @WEIGHTCHANGE @ Height:   Height: 5\' 3"  (160 cm)  Ht Readings from Last 3 Encounters:  09/06/17 5\' 3"  (1.6 m)  08/04/17 5\' 3"  (1.6 m)  03/18/14 5\' 3"  (1.6 m)    General appearance:  alert, cooperative and appears stated age Head: Normocephalic, without obvious abnormality, atraumatic Neck: no adenopathy, supple, symmetrical, trachea midline and thyroid normal to inspection and palpation Lungs: clear to auscultation bilaterally Cardiovascular: regular rate and rhythm Breasts: normal appearance, no masses or tenderness Abdomen: soft, non-tender; non distended,  no masses,  no organomegaly Extremities: extremities normal, atraumatic, no cyanosis or edema Skin: Skin color, texture, turgor normal. No rashes or lesions Lymph nodes: Cervical, supraclavicular, and axillary nodes normal. No abnormal inguinal nodes palpated Neurologic: Grossly normal   Pelvic: External genitalia:  Diffuse erythema, no plaques, no fissures, no whitening              Urethra:  normal appearing urethra with no masses, tenderness or lesions              Bartholins and Skenes: normal                 Vagina: slightly erythematous and atrophic appearing vagina with a slight increase in watery, white vaginal discharge             Cervix: absent               Bimanual Exam:  Uterus:  uterus absent              Adnexa: no mass, fullness, tenderness               Rectovaginal: Confirms               Anus:  normal sphincter tone, no lesions  Chaperone was present for exam.  A:  Well Woman with normal exam  Uncontrolled diabetes, the patient doesn't want to be on medication, feels she is fine  Vulvitis/vulvovaginitis  P:   No pap needed  Discussed breast self exam  Discussed calcium and vit D intake  Labs with primary MD  Strongly encouraged her to f/u with Endocrinology as recommended by her primary. Discussed the harmful effects of uncontrolled diabetes  Atarax for itching at night  Affirm sent  Clobetasol ointment BID for up to 2 weeks, return if not improved  CC: Dr Cathlean Cower

## 2017-09-06 ENCOUNTER — Other Ambulatory Visit: Payer: Self-pay

## 2017-09-06 ENCOUNTER — Encounter: Payer: Self-pay | Admitting: Obstetrics and Gynecology

## 2017-09-06 ENCOUNTER — Ambulatory Visit: Payer: BLUE CROSS/BLUE SHIELD | Admitting: Obstetrics and Gynecology

## 2017-09-06 VITALS — BP 127/82 | HR 64 | Ht 63.0 in | Wt 158.8 lb

## 2017-09-06 DIAGNOSIS — Z8639 Personal history of other endocrine, nutritional and metabolic disease: Secondary | ICD-10-CM | POA: Diagnosis not present

## 2017-09-06 DIAGNOSIS — Z01419 Encounter for gynecological examination (general) (routine) without abnormal findings: Secondary | ICD-10-CM

## 2017-09-06 DIAGNOSIS — Z85038 Personal history of other malignant neoplasm of large intestine: Secondary | ICD-10-CM | POA: Diagnosis not present

## 2017-09-06 DIAGNOSIS — N763 Subacute and chronic vulvitis: Secondary | ICD-10-CM | POA: Diagnosis not present

## 2017-09-06 DIAGNOSIS — N760 Acute vaginitis: Secondary | ICD-10-CM | POA: Diagnosis not present

## 2017-09-06 MED ORDER — CLOBETASOL PROPIONATE 0.05 % EX OINT: 1.0000 "application " | TOPICAL_OINTMENT | Freq: Two times a day (BID) | CUTANEOUS | 0 refills | Status: DC

## 2017-09-06 MED ORDER — HYDROXYZINE HCL 10 MG PO TABS: ORAL_TABLET | ORAL | 0 refills | Status: DC

## 2017-09-06 NOTE — Patient Instructions (Signed)
EXERCISE AND DIET:  We recommended that you start or continue a regular exercise program for good health. Regular exercise means any activity that makes your heart beat faster and makes you sweat.  We recommend exercising at least 30 minutes per day at least 3 days a week, preferably 4 or 5.  We also recommend a diet low in fat and sugar.  Inactivity, poor dietary choices and obesity can cause diabetes, heart attack, stroke, and kidney damage, among others.    ALCOHOL AND SMOKING:  Women should limit their alcohol intake to no more than 7 drinks/beers/glasses of wine (combined, not each!) per week. Moderation of alcohol intake to this level decreases your risk of breast cancer and liver damage. And of course, no recreational drugs are part of a healthy lifestyle.  And absolutely no smoking or even second hand smoke. Most people know smoking can cause heart and lung diseases, but did you know it also contributes to weakening of your bones? Aging of your skin?  Yellowing of your teeth and nails?  CALCIUM AND VITAMIN D:  Adequate intake of calcium and Vitamin D are recommended.  The recommendations for exact amounts of these supplements seem to change often, but generally speaking 600 mg of calcium (either carbonate or citrate) and 800 units of Vitamin D per day seems prudent. Certain women may benefit from higher intake of Vitamin D.  If you are among these women, your doctor will have told you during your visit.    PAP SMEARS:  Pap smears, to check for cervical cancer or precancers,  have traditionally been done yearly, although recent scientific advances have shown that most women can have pap smears less often.  However, every woman still should have a physical exam from her gynecologist every year. It will include a breast check, inspection of the vulva and vagina to check for abnormal growths or skin changes, a visual exam of the cervix, and then an exam to evaluate the size and shape of the uterus and  ovaries.  And after 67 years of age, a rectal exam is indicated to check for rectal cancers. We will also provide age appropriate advice regarding health maintenance, like when you should have certain vaccines, screening for sexually transmitted diseases, bone density testing, colonoscopy, mammograms, etc.   MAMMOGRAMS:  All women over 40 years old should have a yearly mammogram. Many facilities now offer a "3D" mammogram, which may cost around $50 extra out of pocket. If possible,  we recommend you accept the option to have the 3D mammogram performed.  It both reduces the number of women who will be called back for extra views which then turn out to be normal, and it is better than the routine mammogram at detecting truly abnormal areas.    COLONOSCOPY:  Colonoscopy to screen for colon cancer is recommended for all women at age 50.  We know, you hate the idea of the prep.  We agree, BUT, having colon cancer and not knowing it is worse!!  Colon cancer so often starts as a polyp that can be seen and removed at colonscopy, which can quite literally save your life!  And if your first colonoscopy is normal and you have no family history of colon cancer, most women don't have to have it again for 10 years.  Once every ten years, you can do something that may end up saving your life, right?  We will be happy to help you get it scheduled when you are ready.    Be sure to check your insurance coverage so you understand how much it will cost.  It may be covered as a preventative service at no cost, but you should check your particular policy.      Breast Self-Awareness Breast self-awareness means being familiar with how your breasts look and feel. It involves checking your breasts regularly and reporting any changes to your health care provider. Practicing breast self-awareness is important. A change in your breasts can be a sign of a serious medical problem. Being familiar with how your breasts look and feel allows  you to find any problems early, when treatment is more likely to be successful. All women should practice breast self-awareness, including women who have had breast implants. How to do a breast self-exam One way to learn what is normal for your breasts and whether your breasts are changing is to do a breast self-exam. To do a breast self-exam: Look for Changes  1. Remove all the clothing above your waist. 2. Stand in front of a mirror in a room with good lighting. 3. Put your hands on your hips. 4. Push your hands firmly downward. 5. Compare your breasts in the mirror. Look for differences between them (asymmetry), such as: ? Differences in shape. ? Differences in size. ? Puckers, dips, and bumps in one breast and not the other. 6. Look at each breast for changes in your skin, such as: ? Redness. ? Scaly areas. 7. Look for changes in your nipples, such as: ? Discharge. ? Bleeding. ? Dimpling. ? Redness. ? A change in position. Feel for Changes  Carefully feel your breasts for lumps and changes. It is best to do this while lying on your back on the floor and again while sitting or standing in the shower or tub with soapy water on your skin. Feel each breast in the following way:  Place the arm on the side of the breast you are examining above your head.  Feel your breast with the other hand.  Start in the nipple area and make  inch (2 cm) overlapping circles to feel your breast. Use the pads of your three middle fingers to do this. Apply light pressure, then medium pressure, then firm pressure. The light pressure will allow you to feel the tissue closest to the skin. The medium pressure will allow you to feel the tissue that is a little deeper. The firm pressure will allow you to feel the tissue close to the ribs.  Continue the overlapping circles, moving downward over the breast until you feel your ribs below your breast.  Move one finger-width toward the center of the body.  Continue to use the  inch (2 cm) overlapping circles to feel your breast as you move slowly up toward your collarbone.  Continue the up and down exam using all three pressures until you reach your armpit.  Write Down What You Find  Write down what is normal for each breast and any changes that you find. Keep a written record with breast changes or normal findings for each breast. By writing this information down, you do not need to depend only on memory for size, tenderness, or location. Write down where you are in your menstrual cycle, if you are still menstruating. If you are having trouble noticing differences in your breasts, do not get discouraged. With time you will become more familiar with the variations in your breasts and more comfortable with the exam. How often should I examine my breasts? Examine   your breasts every month. If you are breastfeeding, the best time to examine your breasts is after a feeding or after using a breast pump. If you menstruate, the best time to examine your breasts is 5-7 days after your period is over. During your period, your breasts are lumpier, and it may be more difficult to notice changes. When should I see my health care provider? See your health care provider if you notice:  A change in shape or size of your breasts or nipples.  A change in the skin of your breast or nipples, such as a reddened or scaly area.  Unusual discharge from your nipples.  A lump or thick area that was not there before.  Pain in your breasts.  Anything that concerns you.  This information is not intended to replace advice given to you by your health care provider. Make sure you discuss any questions you have with your health care provider. Document Released: 02/14/2005 Document Revised: 07/23/2015 Document Reviewed: 01/04/2015 Elsevier Interactive Patient Education  2018 [employer]  

## 2017-09-07 ENCOUNTER — Telehealth: Payer: Self-pay

## 2017-09-07 LAB — VAGINITIS/VAGINOSIS, DNA PROBE
Candida Species: POSITIVE — AB
Gardnerella vaginalis: NEGATIVE
TRICHOMONAS VAG: NEGATIVE

## 2017-09-07 NOTE — Telephone Encounter (Signed)
-----   Message from Salvadore Dom, MD sent at 09/07/2017  1:51 PM EDT ----- Please inform + for yeast and treat with diflucan 150 mg x 1, repeat x 1 in 72 hours, #2, no refills

## 2017-09-07 NOTE — Telephone Encounter (Signed)
Left message to call Kaitlyn at [phone]. 

## 2017-09-11 MED ORDER — FLUCONAZOLE 150 MG PO TABS: 150.0000 mg | ORAL_TABLET | Freq: Once | ORAL | 0 refills | Status: AC

## 2017-09-11 NOTE — Telephone Encounter (Signed)
Spoke with patient. Advised of results as seen below from [facility]. Patient verbalizes understanding. Rx for Diflucan 150 mg po x 1 repeat in 72 hours if symptoms persist #2 0RF sent to pharmacy on file. Encounter closed.

## 2017-09-18 ENCOUNTER — Encounter: Payer: Self-pay | Admitting: Endocrinology

## 2017-10-03 ENCOUNTER — Ambulatory Visit (AMBULATORY_SURGERY_CENTER): Payer: Self-pay

## 2017-10-03 VITALS — Ht 63.0 in | Wt 158.0 lb

## 2017-10-03 DIAGNOSIS — Z85038 Personal history of other malignant neoplasm of large intestine: Secondary | ICD-10-CM

## 2017-10-03 MED ORDER — NA SULFATE-K SULFATE-MG SULF 17.5-3.13-1.6 GM/177ML PO SOLN: 1.0000 | Freq: Once | ORAL | 0 refills | Status: AC

## 2017-10-03 NOTE — Progress Notes (Signed)
Denies allergies to eggs or soy products. Denies complication of anesthesia or sedation. Denies use of weight loss medication. Denies use of O2.   Emmi instructions declined.  

## 2017-10-04 ENCOUNTER — Encounter: Payer: Self-pay | Admitting: Internal Medicine

## 2017-10-16 ENCOUNTER — Encounter: Payer: Self-pay | Admitting: Internal Medicine

## 2017-10-16 ENCOUNTER — Ambulatory Visit (AMBULATORY_SURGERY_CENTER): Payer: BLUE CROSS/BLUE SHIELD | Admitting: Internal Medicine

## 2017-10-16 VITALS — BP 124/70 | HR 70 | Temp 98.9°F | Resp 15 | Ht 63.0 in | Wt 158.0 lb

## 2017-10-16 DIAGNOSIS — D127 Benign neoplasm of rectosigmoid junction: Secondary | ICD-10-CM

## 2017-10-16 DIAGNOSIS — Z85038 Personal history of other malignant neoplasm of large intestine: Secondary | ICD-10-CM

## 2017-10-16 DIAGNOSIS — K635 Polyp of colon: Secondary | ICD-10-CM | POA: Diagnosis not present

## 2017-10-16 DIAGNOSIS — Z86010 Personal history of colonic polyps: Secondary | ICD-10-CM | POA: Diagnosis not present

## 2017-10-16 MED ORDER — SODIUM CHLORIDE 0.9 % IV SOLN: 500.0000 mL | Freq: Once | INTRAVENOUS | Status: DC

## 2017-10-16 NOTE — Patient Instructions (Signed)
Handout given on polyps  YOU HAD AN ENDOSCOPIC PROCEDURE TODAY AT THE [facility]:   Refer to the procedure report that was given to you for any specific questions about what was found during the examination.  If the procedure report does not answer your questions, please call your gastroenterologist to clarify.  If you requested that your care partner not be given the details of your procedure findings, then the procedure report has been included in a sealed envelope for you to review at your convenience later.  YOU SHOULD EXPECT: Some feelings of bloating in the abdomen. Passage of more gas than usual.  Walking can help get rid of the air that was put into your GI tract during the procedure and reduce the bloating. If you had a lower endoscopy (such as a colonoscopy or flexible sigmoidoscopy) you may notice spotting of blood in your stool or on the toilet paper. If you underwent a bowel prep for your procedure, you may not have a normal bowel movement for a few days.  Please Note:  You might notice some irritation and congestion in your nose or some drainage.  This is from the oxygen used during your procedure.  There is no need for concern and it should clear up in a day or so.  SYMPTOMS TO REPORT IMMEDIATELY:   Following lower endoscopy (colonoscopy or flexible sigmoidoscopy):  Excessive amounts of blood in the stool  Significant tenderness or worsening of abdominal pains  Swelling of the abdomen that is new, acute  Fever of 100F or higher    For urgent or emergent issues, a gastroenterologist can be reached at any hour by calling [phone].   DIET:  We do recommend a small meal at first, but then you may proceed to your regular diet.  Drink plenty of fluids but you should avoid alcoholic beverages for 24 hours.  ACTIVITY:  You should plan to take it easy for the rest of today and you should NOT DRIVE or use heavy machinery until tomorrow (because of the sedation  medicines used during the test).    FOLLOW UP: Our staff will call the number listed on your records the next business day following your procedure to check on you and address any questions or concerns that you may have regarding the information given to you following your procedure. If we do not reach you, we will leave a message.  However, if you are feeling well and you are not experiencing any problems, there is no need to return our call.  We will assume that you have returned to your regular daily activities without incident.  If any biopsies were taken you will be contacted by phone or by letter within the next 1-3 weeks.  Please call us at [phone] if you have not heard about the biopsies in 3 weeks.    SIGNATURES/CONFIDENTIALITY: You and/or your care partner have signed paperwork which will be entered into your electronic medical record.  These signatures attest to the fact that that the information above on your After Visit Summary has been reviewed and is understood.  Full responsibility of the confidentiality of this discharge information lies with you and/or your care-partner. 

## 2017-10-16 NOTE — Progress Notes (Signed)
Called to room to assist during endoscopic procedure.  Patient ID and intended procedure confirmed with present staff. Received instructions for my participation in the procedure from the performing physician.  

## 2017-10-16 NOTE — Progress Notes (Signed)
Report given to PACU, vss 

## 2017-10-16 NOTE — Op Note (Signed)
[facility] Patient Name: Sarah Berger Procedure Date: 10/16/2017 3:35 PM MRN: [id number] Endoscopist: Jerene Bears , MD Age: 67 Referring MD:  Date of Birth: [birth year] Gender: Female Account #: [id number] Procedure:                Colonoscopy Indications:              High risk colon cancer surveillance: Personal                            history of colon cancer (s/p right hemicolectomy                            2013), Last colonoscopy: August 2014 Medicines:                Monitored Anesthesia Care Procedure:                Pre-Anesthesia Assessment:                           - Prior to the procedure, a History and Physical                            was performed, and patient medications and                            allergies were reviewed. The patient's tolerance of                            previous anesthesia was also reviewed. The risks                            and benefits of the procedure and the sedation                            options and risks were discussed with the patient.                            All questions were answered, and informed consent                            was obtained. Prior Anticoagulants: The patient has                            taken no previous anticoagulant or antiplatelet                            agents. ASA Grade Assessment: II - A patient with                            mild systemic disease. After reviewing the risks                            and benefits, the patient was deemed in  satisfactory condition to undergo the procedure.                           After obtaining informed consent, the colonoscope                            was passed under direct vision. Throughout the                            procedure, the patient's blood pressure, pulse, and                            oxygen saturations were monitored continuously. The                            Model PCF-[id number])  scope was introduced                            through the anus and advanced to the cecum,                            identified by appendiceal orifice and ileocecal                            valve. The colonoscopy was performed without                            difficulty. The patient tolerated the procedure                            well. The quality of the bowel preparation was                            good. The terminal ileum was photographed. Scope In: 3:47:16 PM Scope Out: 3:58:14 PM Scope Withdrawal Time: 0 hours 8 minutes 13 seconds  Total Procedure Duration: 0 hours 10 minutes 58 seconds  Findings:                 The digital rectal exam was normal.                           The neo-terminal ileum appeared normal.                           There was evidence of a prior end-to-side                            ileo-colonic anastomosis at the hepatic flexure.                            This was patent and was characterized by healthy                            appearing mucosa.  A few small-mouthed diverticula were found in the                            sigmoid colon.                           Two sessile polyps were found in the recto-sigmoid                            colon. The polyps were 2 to 3 mm in size. These                            polyps were removed with a cold snare. Resection                            and retrieval were complete.                           A diffuse area of moderate melanosis was found in                            the rectum, in the sigmoid colon and in the                            descending colon.                           Internal hemorrhoids were found during                            retroflexion. The hemorrhoids were small. Complications:            No immediate complications. Estimated Blood Loss:     Estimated blood loss was minimal. Impression:               - The examined portion of the ileum was  normal.                           - Patent end-to-side ileo-colonic anastomosis,                            characterized by healthy appearing mucosa.                           - Diverticulosis in the sigmoid colon.                           - Two 2 to 3 mm polyps at the recto-sigmoid colon,                            removed with a cold snare. Resected and retrieved.                           - Melanosis in the colon.                           -  Small internal hemorrhoids. Recommendation:           - Patient has a contact number available for                            emergencies. The signs and symptoms of potential                            delayed complications were discussed with the                            patient. Return to normal activities tomorrow.                            Written discharge instructions were provided to the                            patient.                           - Resume previous diet.                           - Continue present medications.                           - Await pathology results.                           - Repeat colonoscopy in 5 years for surveillance. Jerene Bears, MD 10/16/2017 4:03:52 PM This report has been signed electronically.

## 2017-10-17 ENCOUNTER — Telehealth: Payer: Self-pay | Admitting: *Deleted

## 2017-10-17 NOTE — Telephone Encounter (Signed)
  Follow up Call-  Call back number 10/16/2017  Post procedure Call Back phone  # [phone]  Permission to leave phone message No  Some recent data might be hidden     Patient questions:  Do you have a fever, pain , or abdominal swelling? No. Pain Score  0 *  Have you tolerated food without any problems? Yes.    Have you been able to return to your normal activities? Yes.    Do you have any questions about your discharge instructions: Diet   No. Medications  No. Follow up visit  No.  Do you have questions or concerns about your Care? No.  Actions: * If pain score is 4 or above: No action needed, pain <4.

## 2017-10-23 ENCOUNTER — Encounter: Payer: Self-pay | Admitting: Internal Medicine

## 2017-10-26 ENCOUNTER — Telehealth: Payer: Self-pay | Admitting: Obstetrics and Gynecology

## 2017-10-26 NOTE — Telephone Encounter (Signed)
Spoke with patient. Reports external vaginal itching has improved, not resolved.  Trying epsom salt baths at night, helps her rest and relieves itch. Intermittent white vaginal d/c since last OV. This initially resolved with diflucan x2.   Requesting refill of clobetasol ointment.  Denies odor, bleeding or pain.   Advised will review with Dr. Talbert Nan and return call, patient agreeable.   Dr. Talbert Nan -please advise on refill.

## 2017-10-26 NOTE — Telephone Encounter (Signed)
Patient states she is still itching following appointment on 09/06/17 and would like a refill on the cream that was prescribed to her.

## 2017-10-26 NOTE — Telephone Encounter (Signed)
The patient should return for f/u. She had candida at her last visit, if she still has this she needs further treatment.  Steroids are not meant for long term daily use. Her diabetes increases her risk of recurrent infections.

## 2017-10-27 NOTE — Telephone Encounter (Signed)
Spoke with patient, advised as seen below per Dr. Talbert Nan. Patient denies hx of diabetes. Advised per review of OV notes dated 09/06/17 she has hx of DM. Patient declined to discuss further and changed the subject to scheduling. OV scheduled for 11/01/17 at 4pm with Dr. Talbert Nan.   Routing to provider for final review. Patient is agreeable to disposition. Will close encounter.

## 2017-10-27 NOTE — Telephone Encounter (Signed)
Left message to call Bryssa Tones at [phone].  

## 2017-10-31 ENCOUNTER — Telehealth: Payer: Self-pay | Admitting: Obstetrics and Gynecology

## 2017-10-31 NOTE — Telephone Encounter (Signed)
Patient canceled her upcoming appointment for vaginal itching. Patient states that she no longer needs this appointment and does not  wish to reschedule at this time.

## 2017-11-01 ENCOUNTER — Ambulatory Visit: Payer: Self-pay | Admitting: Obstetrics and Gynecology

## 2017-11-06 ENCOUNTER — Other Ambulatory Visit (INDEPENDENT_AMBULATORY_CARE_PROVIDER_SITE_OTHER): Payer: BLUE CROSS/BLUE SHIELD

## 2017-11-06 ENCOUNTER — Telehealth: Payer: Self-pay | Admitting: Internal Medicine

## 2017-11-06 DIAGNOSIS — E119 Type 2 diabetes mellitus without complications: Secondary | ICD-10-CM

## 2017-11-06 LAB — BASIC METABOLIC PANEL
BUN: 9 mg/dL (ref 6–23)
CALCIUM: 9.4 mg/dL (ref 8.4–10.5)
CO2: 28 meq/L (ref 19–32)
Chloride: 100 mEq/L (ref 96–112)
Creatinine, Ser: 0.69 mg/dL (ref 0.40–1.20)
GFR: 109.06 mL/min (ref >60.00)
GLUCOSE: 296 mg/dL — AB (ref 70–99)
Potassium: 3.7 mEq/L (ref 3.5–5.1)
SODIUM: 135 meq/L (ref 135–145)

## 2017-11-06 LAB — LIPID PANEL
CHOL/HDL RATIO: 4
Cholesterol: 239 mg/dL — ABNORMAL HIGH (ref 0–200)
HDL: 64.8 mg/dL (ref >39.00)
LDL Cholesterol: 151 mg/dL — ABNORMAL HIGH (ref 0–99)
NONHDL: 174.17
Triglycerides: 118 mg/dL (ref 0.0–149.0)
VLDL: 23.6 mg/dL (ref 0.0–40.0)

## 2017-11-06 LAB — HEPATIC FUNCTION PANEL
ALT: 12 U/L (ref 0–35)
AST: 11 U/L (ref 0–37)
Albumin: 4 g/dL (ref 3.5–5.2)
Alkaline Phosphatase: 74 U/L (ref 39–117)
BILIRUBIN DIRECT: 0.1 mg/dL (ref 0.0–0.3)
BILIRUBIN TOTAL: 0.8 mg/dL (ref 0.2–1.2)
Total Protein: 7.3 g/dL (ref 6.0–8.3)

## 2017-11-06 LAB — HEMOGLOBIN A1C: Hgb A1c MFr Bld: 14.7 % — ABNORMAL HIGH (ref 4.6–6.5)

## 2017-11-06 NOTE — Telephone Encounter (Signed)
Pt called [facility] to schedule from the referral that was placed in June.  They are wanting pt to get some updated labs before she can be scheduled.   Can you please put some new lab orders in?   Thanks  Copied from [place] [phone]. Topic: Referral - Question >> Nov 06, 2017 10:16 AM Antonieta Iba C wrote: Reason for CRM: pt called in to follow up on referral to    Pt called in to follow up on Endo referral. Pt says that she was told by location that they needed updated lab work before scheduling.   Please assist pt further.

## 2017-11-06 NOTE — Telephone Encounter (Signed)
Called pt and left detailed msg on vm

## 2017-11-06 NOTE — Telephone Encounter (Signed)
Please ask pt to present for lab only - orders are done

## 2017-11-07 ENCOUNTER — Telehealth: Payer: Self-pay

## 2017-11-07 NOTE — Telephone Encounter (Signed)
-----   Message from Biagio Borg, MD sent at 11/06/2017  6:55 PM EDT ----- Please contact pt - A1c and cholesterol are still very high..  It is unlikely GI will proceed with her evaluation as she needs urgent better control  Please ask pt to make ROV appt

## 2017-11-07 NOTE — Telephone Encounter (Signed)
Called pt, LVM.   CRM created.  

## 2017-11-08 NOTE — Telephone Encounter (Signed)
Okay to close encounter?  °

## 2017-11-14 ENCOUNTER — Telehealth: Payer: Self-pay | Admitting: Internal Medicine

## 2017-11-14 DIAGNOSIS — E119 Type 2 diabetes mellitus without complications: Secondary | ICD-10-CM

## 2017-11-14 NOTE — Telephone Encounter (Signed)
Copied from [place] [phone]. Topic: Quick Communication - See Telephone Encounter >> Nov 14, 2017  3:49 PM Vernona Rieger wrote: CRM for notification. See Telephone encounter for: 11/14/17.  Patient called and stated that she is still having the on going issue with the vaginal itching. She seen her obgyn on 7/10 and they prescribed her clobetasol ointment. She said she mentioned this to Dr Jenny Reichmann at her last appointment with him and he thought maybe she should see an endocrinologist, thinking maybe it could be related to her sugar. She said she did call them but they told her that her referral had expired. I offered her an appointment for tomorrow with Dr Jenny Reichmann to be evaluated but she declined because she is returning back to work tomorrow, she has been off a week for vacation. Can someone give her a call?

## 2017-11-14 NOTE — Telephone Encounter (Signed)
The vaginal itching is not likely related if treated per GYN with a steroid cream and not antifungal.    Pt has been referred to endo in June 2019.....but I will do again for severe uncontrolled DM

## 2017-11-14 NOTE — Addendum Note (Signed)
Addended by: Biagio Borg on: 11/14/2017 05:29 PM   Modules accepted: Orders

## 2017-11-15 NOTE — Telephone Encounter (Signed)
Can you call pt regarding vaginal itching.   Will you let her know that I called [facility] and they have left a msg on her vm for her to call them back. Just in case she asks. Their # is [phone]  thanks

## 2017-11-15 NOTE — Telephone Encounter (Signed)
Called pt, LVM.   

## 2017-11-16 NOTE — Telephone Encounter (Signed)
Pt informed to follow up with GYN

## 2017-12-14 ENCOUNTER — Encounter: Payer: Self-pay | Admitting: Internal Medicine

## 2017-12-14 ENCOUNTER — Telehealth: Payer: Self-pay | Admitting: Internal Medicine

## 2017-12-14 ENCOUNTER — Ambulatory Visit: Payer: BLUE CROSS/BLUE SHIELD | Admitting: Internal Medicine

## 2017-12-14 VITALS — BP 118/66 | HR 88 | Ht 63.0 in | Wt 156.0 lb

## 2017-12-14 DIAGNOSIS — E119 Type 2 diabetes mellitus without complications: Secondary | ICD-10-CM | POA: Diagnosis not present

## 2017-12-14 LAB — GLUCOSE, RANDOM: Glucose, Bld: 373 mg/dL — ABNORMAL HIGH (ref 70–99)

## 2017-12-14 LAB — GLUCOSE, POCT (MANUAL RESULT ENTRY): POC Glucose: 360 mg/dl — AB (ref 70–99)

## 2017-12-14 MED ORDER — FLUCONAZOLE 150 MG PO TABS: ORAL_TABLET | ORAL | 1 refills | Status: DC

## 2017-12-14 MED ORDER — METFORMIN HCL ER 500 MG PO TB24: 1000.0000 mg | ORAL_TABLET | Freq: Two times a day (BID) | ORAL | 6 refills | Status: DC

## 2017-12-14 NOTE — Telephone Encounter (Signed)
Ok to contact pt -   We were notified by her endocrinologist about a yeast infection, so I did an antifungal medication to the pharmacy - done erx

## 2017-12-14 NOTE — Progress Notes (Signed)
Name: Sarah Berger  MRN/ DOB: [id number], September 09, 1950   Age/ Sex: 67 y.o., female    PCP: Biagio Borg, MD   Reason for Endocrinology Evaluation: Type 2 Diabetes Mellitus     Date of Initial Endocrinology Visit: 12/15/2017     PATIENT IDENTIFIER: Sarah Berger is a 67 y.o.  female with a past medical history of T2DM. The patient presented for initial ndocrinology clinic visit on 12/15/2017 for consultative assistance with her diabetes management.    HPI: Sarah Berger is a poor historian , she was not able to provide detailed information about her diabetes diagnosis. Initially she made it sound that she did not know she had diabetes but upon furthter questioning she stated someone "may have told her" that she has diabetes. In review of her records she has had diabetes since 2013 when her A1c at the time was 9.1 %.   Patient has not been on any medications in the past . She was lost to follow up for a couple of years . Her A1c was 16.1 % in June, 2019. Patient started a low carb diet since then with repeat A1c of 14.7 %.   Today he main complaint is vaginal discharge and pruritus.  She does not check her glucose at home. Patient is satisfied that her A1c has dropped from 16 to 14 % . She was under the impression that her glucose now is under good control. We checked her finger stick at the office which was > 300 mg/dL but patient stated she didn't know what that means.   In terms of diet she seems to be doing better in the past 2-3 months.   Patient is interested in natural medicine    HOME DIABETES REGIMEN: None   Statin: No ACE-I/ARB: No Prior Diabetic Education: Declined   METER DOWNLOAD SUMMARY: N/A   DIABETIC COMPLICATIONS: Microvascular complications:    Denies: Neuropathy, nephropathy , retinopathy  Last eye exam: Completed ~  1 year ago  Macrovascular complications:    Denies: CAD, CVA or PVD   PAST HISTORY: Past Medical History:  Past Medical History:    Diagnosis Date  . Anemia    Hx of   . Anemia, unspecified 10/23/2012  . Colon cancer ([place])   . Diabetes mellitus without complication [facility])    Patient denies  . Hematochezia 06/21/2011  . HTN (hypertension) 06/21/2011   Several mild readings in the past, has decline med tx  . Hx of colonic polyp 10/08/1996   Colonoscopy-Dr. Sammuel Cooper   . Hyperlipidemia 06/21/2011  . Impaired glucose tolerance 06/21/2011  . Obesity 06/21/2011   Past Surgical History:  Past Surgical History:  Procedure Laterality Date  . ABDOMINAL HYSTERECTOMY  1995  . bone surgery feet  1993   bone spurs  . HEMICOLECTOMY  10/2011      Social History:  reports that she quit smoking about 33 years ago. She has never used smokeless tobacco. She reports that she does not drink alcohol or use drugs. Family History:  Family History  Problem Relation Age of Onset  . Hypertension Mother   . Colon polyps Mother   . Colon cancer Neg Hx   . Esophageal cancer Neg Hx   . Rectal cancer Neg Hx   . Stomach cancer Neg Hx      HOME MEDICATIONS: Allergies as of 12/14/2017      Reactions   Crestor [rosuvastatin] Other (See Comments)   myalgia   Influenza Vaccines  Lipitor [atorvastatin] Other (See Comments)   myalgia      Medication List        Accurate as of 12/14/17 11:59 PM. Always use your most recent med list.          Astaxanthin 4 MG Caps Take by mouth daily.   Biotin 10 MG Caps Take by mouth daily.   Ca Carbonate-Mag Hydroxide 550-110 MG Chew Chew by mouth.   cholecalciferol 400 units Tabs tablet Commonly known as:  VITAMIN D Take by mouth.   CHROMIUM PICOLINATE PO Take by mouth every morning.   Cinnamon 500 MG capsule Take by mouth daily.   fish oil-omega-3 fatty acids 1000 MG capsule Take 1 g by mouth daily.   fluconazole 150 MG tablet Commonly known as:  DIFLUCAN 1 tab by mouth every 3 days as needed   GENTLE IRON PO Take by mouth daily.   Krill Oil 1000 MG Caps Take by mouth  daily.   metFORMIN 500 MG 24 hr tablet Commonly known as:  GLUCOPHAGE-XR Take 2 tablets (1,000 mg total) by mouth 2 (two) times daily.   SPIRULINA PO Take by mouth daily.   vitamin B-12 100 MCG tablet Commonly known as:  CYANOCOBALAMIN Take 50 mcg by mouth daily.        ALLERGIES: Allergies  Allergen Reactions  . Crestor [Rosuvastatin] Other (See Comments)    myalgia  . Influenza Vaccines   . Lipitor [Atorvastatin] Other (See Comments)    myalgia     REVIEW OF SYSTEMS: A comprehensive ROS was conducted with the patient and is negative except as per HPI and below:  ROS    OBJECTIVE:   VITAL SIGNS: BP 118/66 (BP Location: Left Arm, Patient Position: Sitting)   Pulse 88   Ht 5\' 3"  (1.6 m)   Wt 70.8 kg   LMP 03/01/1987 (Approximate)   SpO2 96%   BMI 27.63 kg/m    PHYSICAL EXAM:  General: Pt appears well and is in NAD  Hydration: Well-hydrated with moist mucous membranes and good skin turgor  HEENT: Head: Unremarkable with good dentition. Oropharynx clear without exudate.  Eyes: External eye exam normal without stare, lid lag or exophthalmos.  EOM intact.  PERRL.  Neck: General: Supple without adenopathy or carotid bruits. Thyroid: Thyroid size normal.  No goiter or nodules appreciated. No thyroid bruit.  Lungs: Clear with good BS bilat with no rales, rhonchi, or wheezes  Heart: RRR with normal S1 and S2 and no gallops; no murmurs; no rub  Abdomen: Normoactive bowel sounds, soft, nontender, without masses or organomegaly palpable  Extremities:  Lower extremities - No pretibial edema. No lesions.  Skin: Normal texture and temperature to palpation. No rash noted. No Acanthosis nigricans/skin tags.   Neuro: MS is good with flat affect, pt is alert and Ox3      DATA REVIEWED: A1c:   Results for [name], [name] (MRN [id number]) as of 12/15/2017 17:07  Ref. Range 10/23/2012 14:28 03/12/2014 07:39 03/26/2015 14:16 08/04/2017 16:28 11/06/2017 14:48  Hemoglobin A1C Latest  Ref Range: 4.6 - 6.5 % 9.5 (H) 10.3 (H) 13.8 (H) 16.1 (H) 14.7 (H)   Results for [name], [name] (MRN [id number]) as of 12/15/2017 17:07  Ref. Range 11/06/2017 14:48  Sodium Latest Ref Range: 135 - 145 mEq/L 135  Potassium Latest Ref Range: 3.5 - 5.1 mEq/L 3.7  Chloride Latest Ref Range: 96 - 112 mEq/L 100  CO2 Latest Ref Range: 19 - 32 mEq/L 28  Glucose Latest Ref  Range: 70 - 99 mg/dL 296 (H)  BUN Latest Ref Range: 6 - 23 mg/dL 9  Creatinine Latest Ref Range: 0.40 - 1.20 mg/dL 0.69  Calcium Latest Ref Range: 8.4 - 10.5 mg/dL 9.4  Alkaline Phosphatase Latest Ref Range: 39 - 117 U/L 74  Albumin Latest Ref Range: 3.5 - 5.2 g/dL 4.0  AST Latest Ref Range: 0 - 37 U/L 11  ALT Latest Ref Range: 0 - 35 U/L 12  Total Protein Latest Ref Range: 6.0 - 8.3 g/dL 7.3  Bilirubin, Direct Latest Ref Range: 0.0 - 0.3 mg/dL 0.1  Total Bilirubin Latest Ref Range: 0.2 - 1.2 mg/dL 0.8  GFR Latest Ref Range: >60.00 mL/min 109.06    Results for EMILENE, ROMA (MRN [id number]) as of 12/15/2017 17:07  Ref. Range 12/14/2017 16:24 12/14/2017 16:24  Glucose Latest Ref Range: 70 - 99 mg/dL  373 (H)  C-Peptide Latest Ref Range: 0.80 - 3.85 ng/mL 2.71     ASSESSMENT / PLAN / RECOMMENDATIONS:   1) Type 2 Diabetes Mellitus, poorly controlled, without complications - Most recent A1c of 14.7% . Goal A1c  < 7.0 %.    GENERAL:  I am not sure if Ms. Hoban has poor health literacy vs she is in denial of her diabetes vs skepticism and lack of trust to medical professional. We spent a lot of time talking about the difference between normal glucose vs her values. We discussed complications of uncontrolled diabetes such as blindness, ESRD, recurrent infection, neuropathy and the risk of amputations.   Patient started crying when I advised her the need for medications. She is interested in "natural alternatives" but I advised her that I do not advise natural products for diabetes management.   I praised and encouraged her to  continue with lifestyle changes.   She was educated by the nurse on glucose finger stick checks.   I ordered GAD-65 to make sure she does not have Latent autoimmune diabetes. As phenotypically she does not quite fit Typ2 DM and she lacks signs of insulin resistance.   Her C-peptide levels came back at 2.71 ng/dL which indicates continued endogenous insulin production- reassuring   She declined CDE/ RD referral   MEDICATIONS:  Metformin 500 mg XR to be titrated gradually to a total of 2000 mg daily   EDUCATION / INSTRUCTIONS:  BG monitoring instructions: Patient is instructed to check her blood sugars 1 times a day(fasting)  Call [facility] if: BG persistently < 70 or > 300. . I reviewed the Rule of 15 for the treatment of hypoglycemia in detail with the patient. Literature supplied.   2) Diabetic complications:   Eye: Does not have known diabetic retinopathy. Last eye exam was ~1 year ago.   Neuro/ Feet: Does not have known diabetic peripheral neuropathy    Renal: Patient does not have known baseline CKD.     3) Lipids: Patient is  not on a statin.     Forty Five minutes spent with the patient more than 50% of the time was spent in counseling education and answering questions.   F/U in 3 weeks   Signed electronically by: Mack Guise, MD  [facility]  [facility] [address] [place], [place] [address] Phone: [phone] FAX: [phone]   CC: Biagio Borg, MD [place] [address] Phone: [phone]  Fax: [phone]    Return to Endocrinology clinic as below: Future Appointments  Date Time  Provider [place]  01/05/2018  7:30 AM Shammleffer, Melanie Crazier, MD [facility] None  02/02/2018  1:20 PM Biagio Borg, MD [facility]

## 2017-12-14 NOTE — Patient Instructions (Addendum)
-   Please check your sugar once a day in the morning before you eat (fasting) - Please start taking Metformin as below : Start 1 pill daily x 1 week, then increase to 1 pill twice a day x 1 week, then increase to 2 pills in AM and 1 pill in PM, finally 2 pills twice a day. - If you change your mind about seeing a diabetic educator, please call our office and we will start a referral for you.

## 2017-12-15 ENCOUNTER — Encounter: Payer: Self-pay | Admitting: Internal Medicine

## 2017-12-15 NOTE — Telephone Encounter (Signed)
Called pt, LVM.   

## 2017-12-17 LAB — C-PEPTIDE: C-Peptide: 2.71 ng/mL (ref 0.80–3.85)

## 2017-12-17 LAB — GLUTAMIC ACID DECARBOXYLASE AUTO ABS: Glutamic Acid Decarb Ab: <5 IU/mL (ref <5)

## 2018-01-02 ENCOUNTER — Telehealth: Payer: Self-pay | Admitting: Internal Medicine

## 2018-01-02 NOTE — Telephone Encounter (Signed)
Patient started taking Metformin Oct. 17th-patient is now experiencing the following symptom: diahrrea Pharmacist advised patient to take Metformin with food which she is but she is still having diahrrea. Patient wants to know what she should do. Please call patient at ph# [phone]

## 2018-01-02 NOTE — Telephone Encounter (Signed)
Pt stated that she is having about 3 bouts of diarrhea a day and not experiencing any other symptoms at this time, please advise.

## 2018-01-02 NOTE — Telephone Encounter (Signed)
Pt is on 2 tabs 2 times daily, would you like to cut her down to 1 tab twice a day?

## 2018-01-02 NOTE — Telephone Encounter (Signed)
Pt stated that she has appt Friday and will stop taking metformin until then

## 2018-01-05 ENCOUNTER — Ambulatory Visit: Payer: BLUE CROSS/BLUE SHIELD | Admitting: Internal Medicine

## 2018-01-05 ENCOUNTER — Encounter: Payer: Self-pay | Admitting: Internal Medicine

## 2018-01-05 VITALS — BP 136/80 | HR 68 | Resp 16 | Ht 63.0 in | Wt 153.4 lb

## 2018-01-05 DIAGNOSIS — E119 Type 2 diabetes mellitus without complications: Secondary | ICD-10-CM | POA: Diagnosis not present

## 2018-01-05 NOTE — Progress Notes (Signed)
Name: Sarah Berger  Age/ Sex: 67 y.o., female   MRN/ DOB: [id number], 1950-05-23     PCP: Biagio Borg, MD   Reason for Endocrinology Evaluation: Type 2 Diabetes Mellitus  Initial Endocrine Consultative Visit: 12/14/17    PATIENT IDENTIFIER: Sarah Berger is a 67 y.o. female with a past medical history of T2DM. The patient has followed with Endocrinology clinic since for consultative assistance with management of her diabetes.  DIABETIC HISTORY:  Sarah Berger was diagnosed with DM in 2013, but patient was reluctant to start on any glycemic medications. Her hemoglobin A1c has ranged from 9.1% in 2013, peaking at 16.1% in 07/2017.   SUBJECTIVE:   During the last visit (12/14/17): Her A1c was 14.7%. She was very reluctant to start any  started on Metformin with titration to increase the dose to a total of 2000 mg daily   Today (01/05/2018): Sarah Berger is here for a 1 month follow up on her diabetes management.   She checks her blood sugars 1-2 times daily. The patient has not had hypoglycemic episodes since the last clinic visit. Otherwise, the patient has not required any recent emergency interventions for hypoglycemia and has not had recent hospitalizations secondary to hyper or hypoglycemic episodes.   She started the Metformin on 10/17, she was able to titrate up to 4 tablets a day but developed diarrhea. She attributes this to taking it without food. She helt Metformin for 1 day , diarrhea have resolved and has been tolerating a total of 2 tabs a day    ROS: As per HPI and as detailed below: Review of Systems  Cardiovascular: Negative for chest pain and palpitations.  Gastrointestinal: Positive for diarrhea. Negative for nausea.  Genitourinary: Negative for frequency.  Endo/Heme/Allergies: Negative for polydipsia.      HOME DIABETES REGIMEN:  Metformin XR 500 mg two tabs BID- Stopped due to diarrhea      METER DOWNLOAD SUMMARY: Date range evaluated:10/9- 01/05/18 Fingerstick Blood Glucose Tests = 23 Average Number Tests/Day = 0.7 Overall Mean FS Glucose = 266 Standard Deviation = 66  BG Ranges: Low = 182 High = 463 (postprandial)   Hypoglycemic Events/30 Days: BG < 50 = 0 Episodes of symptomatic severe hypoglycemia = 0    HISTORY:  Past Medical History:  Past Medical History:  Diagnosis Date  . Anemia    Hx of   . Anemia, unspecified 10/23/2012  . Colon cancer ([place])   . Diabetes mellitus without complication [facility])    Patient denies  . Hematochezia 06/21/2011  . HTN (hypertension) 06/21/2011   Several mild readings in the past, has decline med tx  . Hx of colonic polyp 10/08/1996   Colonoscopy-Dr. Sammuel Cooper   . Hyperlipidemia 06/21/2011  . Impaired glucose tolerance 06/21/2011  . Obesity 06/21/2011    Past Surgical History:  Past Surgical History:  Procedure Laterality Date  . ABDOMINAL HYSTERECTOMY  1995  . bone surgery feet  1993   bone spurs  . HEMICOLECTOMY  10/2011     Social History:  reports that she quit smoking about 33 years ago. She has never used smokeless tobacco. She reports that she does not drink alcohol or use drugs. Family History:  Family History  Problem Relation Age of Onset  . Hypertension Mother   . Colon polyps Mother   . Colon cancer Neg Hx   . Esophageal cancer Neg Hx   . Rectal cancer Neg Hx   . Stomach cancer Neg Hx  HOME MEDICATIONS: Allergies as of 01/05/2018      Reactions   Crestor [rosuvastatin] Other (See Comments)   myalgia   Influenza Vaccines    Lipitor [atorvastatin] Other (See Comments)   myalgia      Medication List        Accurate as of 01/05/18  7:44 AM. Always use your most recent med list.          Astaxanthin 4 MG Caps Take by mouth daily.   Biotin 10 MG Caps Take by mouth daily.   Ca Carbonate-Mag Hydroxide 550-110 MG Chew Chew by mouth.   cholecalciferol 10 MCG (400 UNIT) Tabs  tablet Commonly known as:  VITAMIN D3 Take by mouth.   CHROMIUM PICOLINATE PO Take by mouth every morning.   Cinnamon 500 MG capsule Take by mouth daily.   fish oil-omega-3 fatty acids 1000 MG capsule Take 1 g by mouth daily.   fluconazole 150 MG tablet Commonly known as:  DIFLUCAN 1 tab by mouth every 3 days as needed   GENTLE IRON PO Take by mouth daily.   Krill Oil 1000 MG Caps Take by mouth daily.   metFORMIN 500 MG 24 hr tablet Commonly known as:  GLUCOPHAGE-XR Take 2 tablets (1,000 mg total) by mouth 2 (two) times daily.   SPIRULINA PO Take by mouth daily.   vitamin B-12 100 MCG tablet Commonly known as:  CYANOCOBALAMIN Take 50 mcg by mouth daily.        OBJECTIVE:   Vital Signs: BP 136/80 (BP Location: Left Arm, Patient Position: Sitting, Cuff Size: Normal)   Pulse 68   Resp 16   Ht 5\' 3"  (1.6 m)   Wt 153 lb 6.4 oz (69.6 kg)   LMP 03/01/1987 (Approximate)   SpO2 100%   BMI 27.17 kg/m   Wt Readings from Last 3 Encounters:  01/05/18 153 lb 6.4 oz (69.6 kg)  12/14/17 156 lb (70.8 kg)  10/16/17 158 lb (71.7 kg)     Exam: General: Pt appears well and is in NAD  Neck: General: Supple without adenopathy. Thyroid: Thyroid size normal.  No goiter or nodules appreciated. No thyroid bruit.  Lungs: Clear with good BS bilat with no rales, rhonchi, or wheezes  Heart: RRR with normal S1 and S2 and no gallops; no murmurs; no rub  Abdomen: Normoactive bowel sounds, soft, nontender, without masses or organomegaly palpable  Extremities: No pretibial edema.   Skin: Normal texture and temperature to palpation. No rash noted.   Neuro: MS is good with appropriate affect, pt is alert and Ox3       DATA REVIEWED:  Lab Results  Component Value Date   HGBA1C 14.7 (H) 11/06/2017   HGBA1C 16.1 (H) 08/04/2017   HGBA1C 13.8 (H) 03/26/2015   Lab Results  Component Value Date   MICROALBUR 2.0 (H) 08/04/2017   LDLCALC 151 (H) 11/06/2017   CREATININE 0.69  11/06/2017   Lab Results  Component Value Date   MICRALBCREAT 2.7 08/04/2017           ASSESSMENT / PLAN / RECOMMENDATIONS:   1) Type 2 Diabetes Mellitus, poorly controlled, Without complications - Most recent A1c of 14.1 %. Goal A1c < 7.0 %.    Plan: MEDICATIONS:  Praised patient on glucose check and compliance wihh Metformin. She is trying her best to get this under control.   We discussed that in review of her glucose meter data, over the past few weeks, has fasting glucose has trended down from  300's to high 100's mg/dL with just metformin.   She would like to give Metformin another try, and titrate to a total of 4 tabs daily with food.   She understands that if she is intolerant to this, we will keep her on the max tolerated dose of Metformin and consider Glipizide as an add on therapy.   Discussed side effects of SU in causing hypoglycemia and weight gain.  She is not a candidate for SGLT-2 inhibitors due to recurrent vaginal infections and not a candidate for GLP-1 agonist at this time due to reluctance to start injectable at this time.   EDUCATION / INSTRUCTIONS:  BG monitoring instructions: Patient is instructed to check her blood sugars 1 times a day, fasting (Goal 80-130)  Call [facility] if: BG persistently < 70 or > 300.   F/U in 6 weeks   Signed electronically by: Mack Guise, MD  [facility]  [facility] [address] [place], [place] [address] Phone: [phone] FAX: [phone]   CC: Biagio Borg, MD [place] [address] Phone: [phone]  Fax: [phone]  Return to Endocrinology clinic as below: Future Appointments  Date Time Provider [place]  02/02/2018  1:20 PM Biagio Borg, MD [facility]

## 2018-01-05 NOTE — Patient Instructions (Addendum)
-   Check sugar once a day (fasting) - Continue Metformin one tablet with Breakfast and one tablet with Supper for a week, then try to increase to Two tablets with breakfast and one tablet with supper for another week, if no diarrhea or nausea with that increase, please increase to Two tablets with Breakfast and Two with Supper.  - If at any point you develop diarrhea again, please call us and we can start Glipizide.

## 2018-01-19 ENCOUNTER — Telehealth: Payer: Self-pay | Admitting: Internal Medicine

## 2018-01-19 ENCOUNTER — Other Ambulatory Visit: Payer: Self-pay

## 2018-01-19 MED ORDER — EASY COMFORT LANCETS MISC: 1.0000 | Freq: Two times a day (BID) | 6 refills | Status: DC

## 2018-01-19 MED ORDER — GLUCOSE BLOOD VI STRP: ORAL_STRIP | 12 refills | Status: DC

## 2018-01-19 NOTE — Telephone Encounter (Signed)
Pharmacist stated that they do not know what is preferred and covered so I lft vm for pt to return call

## 2018-01-19 NOTE — Telephone Encounter (Signed)
Spoke to pt and pharmacy, sent in supplies for contour next

## 2018-01-19 NOTE — Telephone Encounter (Signed)
Patient is requesting a prescription sent in for  Lancets and test strips. Patient stated she went to the pharmacy and they asked why she is paying full price for this,  Please advise Patient stated her meter was a contour next.

## 2018-01-19 NOTE — Telephone Encounter (Signed)
Patient returned call. Please call patient at ph# [phone].

## 2018-01-19 NOTE — Telephone Encounter (Signed)
Please check with pharmacy

## 2018-01-19 NOTE — Telephone Encounter (Signed)
Please advise 

## 2018-02-02 ENCOUNTER — Ambulatory Visit: Payer: BLUE CROSS/BLUE SHIELD | Admitting: Internal Medicine

## 2018-02-12 ENCOUNTER — Ambulatory Visit (INDEPENDENT_AMBULATORY_CARE_PROVIDER_SITE_OTHER): Payer: BLUE CROSS/BLUE SHIELD | Admitting: Internal Medicine

## 2018-02-12 ENCOUNTER — Encounter: Payer: Self-pay | Admitting: Internal Medicine

## 2018-02-12 VITALS — BP 170/88 | HR 102 | Ht 63.0 in | Wt 152.8 lb

## 2018-02-12 DIAGNOSIS — E119 Type 2 diabetes mellitus without complications: Secondary | ICD-10-CM

## 2018-02-12 LAB — POCT GLYCOSYLATED HEMOGLOBIN (HGB A1C)
HBA1C, POC (CONTROLLED DIABETIC RANGE): 10.1 % — AB (ref 0.0–7.0)
HBA1C, POC (PREDIABETIC RANGE): 10.1 % — AB (ref 5.7–6.4)
HEMOGLOBIN A1C: 10.1 % (ref 4.0–5.6)
Hemoglobin A1C: 10.1 % — AB (ref 4.0–5.6)

## 2018-02-12 MED ORDER — GLIPIZIDE 5 MG PO TABS: 5.0000 mg | ORAL_TABLET | Freq: Two times a day (BID) | ORAL | 3 refills | Status: DC

## 2018-02-12 NOTE — Patient Instructions (Signed)
-   Check sugars fasting and at times during the day   - Continue Metformin 500 mg twice a day with meals

## 2018-02-12 NOTE — Progress Notes (Signed)
Name: Sarah Berger  Age/ Sex: 67 y.o., female   MRN/ DOB: [id number], February 08, 1951     PCP: Biagio Borg, MD   Reason for Endocrinology Evaluation: Type 2 Diabetes Mellitus  Initial Endocrine Consultative Visit: 12/14/17    PATIENT IDENTIFIER: Sarah Berger is a 67 y.o. female with a past medical history of T2DM. The patient has followed with Endocrinology clinic since for consultative assistance with management of her diabetes.  DIABETIC HISTORY:  Sarah Berger was diagnosed with DM in 2013, but patient was reluctant to start on any glycemic medications. Her hemoglobin A1c has ranged from 9.1% in 2013, peaking at 16.1% in 07/2017.Metformin was started in October, 2019   SUBJECTIVE:   During the last visit (01/05/18): She was intolerant to Metformin , she was taking it sometimes on an empty stomach, she wanted to give it another chance. She was provided information on metformin titration.     Today (02/12/2018): Sarah Berger is here for a 6 week  follow up on her diabetes management.   She checks her blood sugars 1 times daily. The patient has not had hypoglycemic episodes since the last clinic visit. Otherwise, the patient has not required any recent emergency interventions for hypoglycemia and has not had recent hospitalizations secondary to hyper or hypoglycemic episodes.  She has been taking Metformin once a day with meals, she is scared sometimes to take it twice a day and at times, she doesn't take it unless she eats a proper "bulky meal"  ROS: As per HPI and as detailed below: Review of Systems  Cardiovascular: Negative for chest pain and palpitations.  Gastrointestinal: Positive for diarrhea. Negative for nausea.  Genitourinary: Negative for frequency.  Endo/Heme/Allergies: Negative for polydipsia.      HOME DIABETES REGIMEN:  Metformin XR 500 mg one tablet daily    METER DOWNLOAD SUMMARY: Date  range evaluated:11/17-12/16/19 Fingerstick Blood Glucose Tests =30 Average Number Tests/Day = 1.0 Overall Mean FS Glucose = 171  Standard Deviation = 15  BG Ranges: Low = 143 High = 198   Hypoglycemic Events/30 Days: BG < 50 = 0 Episodes of symptomatic severe hypoglycemia = 0    HISTORY:  Past Medical History:  Past Medical History:  Diagnosis Date  . Anemia    Hx of   . Anemia, unspecified 10/23/2012  . Colon cancer ([place])   . Diabetes mellitus without complication [facility])    Patient denies  . Hematochezia 06/21/2011  . HTN (hypertension) 06/21/2011   Several mild readings in the past, has decline med tx  . Hx of colonic polyp 10/08/1996   Colonoscopy-Dr. Sammuel Cooper   . Hyperlipidemia 06/21/2011  . Impaired glucose tolerance 06/21/2011  . Obesity 06/21/2011   Past Surgical History:  Past Surgical History:  Procedure Laterality Date  . ABDOMINAL HYSTERECTOMY  1995  . bone surgery feet  1993   bone spurs  . HEMICOLECTOMY  10/2011    Social History:  reports that she quit smoking about 33 years ago. She has never used smokeless tobacco. She reports that she does not drink alcohol or use drugs. Family History:  Family History  Problem Relation Age of Onset  . Hypertension Mother   . Colon polyps Mother   . Colon cancer Neg Hx   . Esophageal cancer Neg Hx   . Rectal cancer Neg Hx   . Stomach cancer Neg Hx      HOME MEDICATIONS: Allergies as of 02/12/2018      Reactions  Crestor [rosuvastatin] Other (See Comments)   myalgia   Influenza Vaccines    Lipitor [atorvastatin] Other (See Comments)   myalgia      Medication List       Accurate as of February 12, 2018  8:39 AM. Always use your most recent med list.        Astaxanthin 4 MG Caps Take by mouth daily.   Biotin 10 MG Caps Take by mouth daily.   Ca Carbonate-Mag Hydroxide 550-110 MG Chew Chew by mouth.   cholecalciferol 10 MCG (400 UNIT) Tabs tablet Commonly known as:  VITAMIN D3 Take by  mouth.   CHROMIUM PICOLINATE PO Take by mouth every morning.   Cinnamon 500 MG capsule Take by mouth daily.   EASY COMFORT LANCETS Misc 1 Package by Does not apply route 2 (two) times daily. Use to test blood sugar 2 times daily   fish oil-omega-3 fatty acids 1000 MG capsule Take 1 g by mouth daily.   fluconazole 150 MG tablet Commonly known as:  DIFLUCAN 1 tab by mouth every 3 days as needed   GENTLE IRON PO Take by mouth daily.   glucose blood test strip Use as instructed to test blood sugar 2 times daily   Krill Oil 1000 MG Caps Take by mouth daily.   metFORMIN 500 MG 24 hr tablet Commonly known as:  GLUCOPHAGE XR Take 2 tablets (1,000 mg total) by mouth 2 (two) times daily.   SPIRULINA PO Take by mouth daily.   vitamin B-12 100 MCG tablet Commonly known as:  CYANOCOBALAMIN Take 50 mcg by mouth daily.        OBJECTIVE:   Vital Signs: LMP 03/01/1987 (Approximate)   Wt Readings from Last 3 Encounters:  01/05/18 153 lb 6.4 oz (69.6 kg)  12/14/17 156 lb (70.8 kg)  10/16/17 158 lb (71.7 kg)     Exam: General: Pt appears well and is in NAD  Neck: General: Supple without adenopathy. Thyroid: Thyroid size normal.  No goiter or nodules appreciated. No thyroid bruit.  Lungs: Clear with good BS bilat with no rales, rhonchi, or wheezes  Heart: RRR with normal S1 and S2 and no gallops; no murmurs; no rub  Abdomen: Normoactive bowel sounds, soft, nontender, without masses or organomegaly palpable  Extremities: No pretibial edema.   Neuro: MS is good with appropriate affect, pt is alert and Ox3       DATA REVIEWED:  Lab Results  Component Value Date   HGBA1C 14.7 (H) 11/06/2017   HGBA1C 16.1 (H) 08/04/2017   HGBA1C 13.8 (H) 03/26/2015    ASSESSMENT / PLAN / RECOMMENDATIONS:   1) Type 2 Diabetes Mellitus, poorly controlled, Without complications - Most recent A1c of %. Goal A1c < 7.0 %.    Plan: MEDICATIONS:  Praised patient on glucose check and  compliance with Metformin. She is trying her best to get this under control. She has done an awesome work despite the holidays.   We discussed that in review of her glucose meter data, over the past few weeks, has fasting glucose has trended down from 300's to the mid 100's mg/dL with just metformin.   She is still scared of using metformin to its full dose, I have tried to change it last visit to glipizide , but at the time, she attributed the diarrhea to eating it without meals, this time I am trying to switch it again but she believes the problem is the type of food she is eating.  I have tried to explain to her that she is intolerant to Metformin and there's no need to continue taking it, we discussed  Glipizide as an add on therapy.   She would like to finish what she has first and she will call me   Discussed side effects of SU in causing hypoglycemia and weight gain.  She is not a candidate for SGLT-2 inhibitors due to recurrent vaginal infections and not a candidate for GLP-1 agonist at this time due to reluctance to start injectable at this time.    Medications   She wants to try taking Metformin 500 mg XR twice a day If intolerant, she will switch to Glipizide 5 mg twice a day with meals.     EDUCATION / INSTRUCTIONS:  BG monitoring instructions: Patient is instructed to check her blood sugars 1 times a day, fasting (Goal 80-130)  Call [place] [facility] if: BG persistently < 70 or > 300.   F/U in 8 weeks   Signed electronically by: Mack Guise, MD  [facility]  [address] [facility] [address] [place], [place] [address] Phone: [phone] FAX: [phone]   CC: Biagio Borg, MD [place] [address] Phone: [phone]  Fax: [phone]  Return to Endocrinology clinic as below: Future Appointments  Date Time Provider [place]  02/12/2018  4:00 PM Aleiyah Halpin, Melanie Crazier, MD [facility] None

## 2018-04-16 ENCOUNTER — Encounter: Payer: Self-pay | Admitting: Internal Medicine

## 2018-04-16 ENCOUNTER — Ambulatory Visit (INDEPENDENT_AMBULATORY_CARE_PROVIDER_SITE_OTHER): Payer: BLUE CROSS/BLUE SHIELD | Admitting: Internal Medicine

## 2018-04-16 VITALS — BP 130/70 | HR 69 | Ht 63.0 in | Wt 147.2 lb

## 2018-04-16 DIAGNOSIS — E119 Type 2 diabetes mellitus without complications: Secondary | ICD-10-CM | POA: Diagnosis not present

## 2018-04-16 MED ORDER — METFORMIN HCL ER 500 MG PO TB24: 500.0000 mg | ORAL_TABLET | Freq: Three times a day (TID) | ORAL | 6 refills | Status: DC

## 2018-04-16 NOTE — Patient Instructions (Signed)
-   Continue Metformin 500 mg One tablet three times a day with meals

## 2018-04-16 NOTE — Progress Notes (Signed)
Name: Sarah Berger  Age/ Sex: 68 y.o., female   MRN/ DOB: [id number], 10-13-1950     PCP: Biagio Borg, MD   Reason for Endocrinology Evaluation: Type 2 Diabetes Mellitus  Initial Endocrine Consultative Visit: 12/14/17    PATIENT IDENTIFIER: Sarah Berger is a 68 y.o. female with a past medical history of T2DM. The patient has followed with Endocrinology clinic since for consultative assistance with management of her diabetes.  DIABETIC HISTORY:  Sarah Berger was diagnosed with DM in 2013, but patient was reluctant to start on any glycemic medications. Her hemoglobin A1c has ranged from 9.1% in 2013, peaking at 16.1% in 07/2017.Metformin was started in October, 2019   SUBJECTIVE:   During the last visit (02/12/18): Despite intolerance toMetformin , she wanted to give it another try by increasing it to twice a day, I have offered her SU.     Today (04/17/2018): Sarah Berger is here for an 8 week  follow up on her diabetes management.   She checks her blood sugars 1 times daily. The patient has not had hypoglycemic episodes since the last clinic visit. Otherwise, the patient has not required any recent emergency interventions for hypoglycemia and has not had recent hospitalizations secondary to hyper or hypoglycemic episodes.    ROS: As per HPI and as detailed below: Review of Systems  Cardiovascular: Negative for chest pain and palpitations.  Gastrointestinal: Negative for diarrhea and nausea.  Genitourinary: Negative for frequency.  Endo/Heme/Allergies: Negative for polydipsia.      HOME DIABETES REGIMEN:  Metformin XR 500 mg bid   METER DOWNLOAD SUMMARY: Date range evaluated:1/18-2/17/20 Fingerstick Blood Glucose Tests = 31 Average Number Tests/Day = 1.0 Overall Mean FS Glucose = 160  Standard Deviation = 13  BG Ranges: Low = 139 High = 194   Hypoglycemic Events/30 Days: BG < 50 =  0 Episodes of symptomatic severe hypoglycemia = 0    HISTORY:  Past Medical History:  Past Medical History:  Diagnosis Date  . Anemia    Hx of   . Anemia, unspecified 10/23/2012  . Colon cancer ([place])   . Diabetes mellitus without complication [facility])    Patient denies  . Hematochezia 06/21/2011  . HTN (hypertension) 06/21/2011   Several mild readings in the past, has decline med tx  . Hx of colonic polyp 10/08/1996   Colonoscopy-Dr. Sammuel Cooper   . Hyperlipidemia 06/21/2011  . Impaired glucose tolerance 06/21/2011  . Obesity 06/21/2011   Past Surgical History:  Past Surgical History:  Procedure Laterality Date  . ABDOMINAL HYSTERECTOMY  1995  . bone surgery feet  1993   bone spurs  . HEMICOLECTOMY  10/2011    Social History:  reports that she quit smoking about 33 years ago. She has never used smokeless tobacco. She reports that she does not drink alcohol or use drugs. Family History:  Family History  Problem Relation Age of Onset  . Hypertension Mother   . Colon polyps Mother   . Colon cancer Neg Hx   . Esophageal cancer Neg Hx   . Rectal cancer Neg Hx   . Stomach cancer Neg Hx      HOME MEDICATIONS: Allergies as of 04/16/2018      Reactions   Crestor [rosuvastatin] Other (See Comments)   myalgia   Influenza Vaccines    Lipitor [atorvastatin] Other (See Comments)   myalgia      Medication List       Accurate as of April 16, 2018 11:59 PM. Always use your most recent med list.        Astaxanthin 4 MG Caps Take by mouth daily.   Biotin 10 MG Caps Take by mouth daily.   Ca Carbonate-Mag Hydroxide 550-110 MG Chew Chew by mouth.   cholecalciferol 10 MCG (400 UNIT) Tabs tablet Commonly known as:  VITAMIN D3 Take by mouth.   CHROMIUM PICOLINATE PO Take by mouth every morning.   Cinnamon 500 MG capsule Take by mouth daily.   EASY COMFORT LANCETS Misc 1 Package by Does not apply route 2 (two) times daily. Use to test blood sugar 2 times daily   fish  oil-omega-3 fatty acids 1000 MG capsule Take 1 g by mouth daily.   fluconazole 150 MG tablet Commonly known as:  DIFLUCAN 1 tab by mouth every 3 days as needed   GENTLE IRON PO Take by mouth daily.   glucose blood test strip Use as instructed to test blood sugar 2 times daily   Krill Oil 1000 MG Caps Take by mouth daily.   metFORMIN 500 MG 24 hr tablet Commonly known as:  GLUCOPHAGE XR Take 1 tablet (500 mg total) by mouth 3 (three) times daily.   SPIRULINA PO Take by mouth daily.   vitamin B-12 100 MCG tablet Commonly known as:  CYANOCOBALAMIN Take 50 mcg by mouth daily.        OBJECTIVE:   Vital Signs: BP 130/70 (BP Location: Left Arm, Patient Position: Sitting, Cuff Size: Normal)   Pulse 69   Ht 5\' 3"  (1.6 m)   Wt 147 lb 3.2 oz (66.8 kg)   LMP 03/01/1987 (Approximate)   SpO2 98%   BMI 26.08 kg/m   Wt Readings from Last 3 Encounters:  04/16/18 147 lb 3.2 oz (66.8 kg)  02/12/18 152 lb 12.8 oz (69.3 kg)  01/05/18 153 lb 6.4 oz (69.6 kg)     Exam: General: Pt appears well and is in NAD  Neck: General: Supple without adenopathy. Thyroid: Thyroid size normal.  No goiter or nodules appreciated. No thyroid bruit.  Lungs: Clear with good BS bilat with no rales, rhonchi, or wheezes  Heart: RRR with normal S1 and S2 and no gallops; no murmurs; no rub  Abdomen: Normoactive bowel sounds, soft, nontender, without masses or organomegaly palpable  Extremities: No pretibial edema.   Neuro: MS is good with appropriate affect, pt is alert and Ox3       DATA REVIEWED:  Lab Results  Component Value Date   HGBA1C 10.1 (A) 02/12/2018   HGBA1C 10.1 02/12/2018   HGBA1C 10.1 (A) 02/12/2018   HGBA1C 10.1 (A) 02/12/2018    ASSESSMENT / PLAN / RECOMMENDATIONS:   1) Type 2 Diabetes Mellitus, poorly controlled, Without complications - Most recent A1c of 10.1%. Goal A1c < 7.0 %.    Plan: MEDICATIONS:  Praised patient on glucose check and compliance with Metformin.  She is trying her best to get this under control. She has done an awesome work thus far and taking care of her diabetes  She has been taking metformin 3 times a day with meals, she cannot tolerate 2 tablets of metformin at the time, she has been doing good so far with this regimen.  In review of her glucose data most of her glucose readings have been within the goal of less than 180 mg/dL, patient is wondering when if she can get off the metformin, I have explained to her that we will not be backing down on  metformin unless her fasting glucose starts trending below 100 mg/dL  She is not a candidate for SGLT-2 inhibitors due to recurrent vaginal infections and not a candidate for GLP-1 agonist at this time due to reluctance to start injectable at this time.    Medications  Continue metformin 500 mg XR 1 tablet 3 times daily with meals   EDUCATION / INSTRUCTIONS:  BG monitoring instructions: Patient is instructed to check her blood sugars 1 times a day, fasting (Goal 80-130)  Call [facility] clinic if: BG persistently < 70 or > 300.   F/U in 12 weeks   Signed electronically by: Mack Guise, MD  [facility]  [address] [place], [place] [address] Phone: [phone] FAX: [phone]   CC: Biagio Borg, MD [place] [address] Phone: [phone]  Fax: [phone]  Return to Endocrinology clinic as below: Future Appointments  Date Time Provider [place]  07/16/2018  3:40 PM Nolawi Kanady, Melanie Crazier, MD [facility] None

## 2018-07-16 ENCOUNTER — Ambulatory Visit: Payer: BLUE CROSS/BLUE SHIELD | Admitting: Internal Medicine

## 2018-08-07 ENCOUNTER — Ambulatory Visit: Payer: BLUE CROSS/BLUE SHIELD | Admitting: Internal Medicine

## 2019-03-17 ENCOUNTER — Other Ambulatory Visit: Payer: Self-pay | Admitting: Internal Medicine

## 2021-01-05 DIAGNOSIS — E11311 Type 2 diabetes mellitus with unspecified diabetic retinopathy with macular edema: Secondary | ICD-10-CM | POA: Diagnosis not present

## 2021-01-05 DIAGNOSIS — H5213 Myopia, bilateral: Secondary | ICD-10-CM | POA: Diagnosis not present

## 2021-01-26 DIAGNOSIS — H31092 Other chorioretinal scars, left eye: Secondary | ICD-10-CM | POA: Diagnosis not present

## 2021-01-26 DIAGNOSIS — E113313 Type 2 diabetes mellitus with moderate nonproliferative diabetic retinopathy with macular edema, bilateral: Secondary | ICD-10-CM | POA: Diagnosis not present

## 2021-01-26 DIAGNOSIS — H43823 Vitreomacular adhesion, bilateral: Secondary | ICD-10-CM | POA: Diagnosis not present

## 2021-01-26 DIAGNOSIS — H35033 Hypertensive retinopathy, bilateral: Secondary | ICD-10-CM | POA: Diagnosis not present

## 2021-02-01 DIAGNOSIS — E113312 Type 2 diabetes mellitus with moderate nonproliferative diabetic retinopathy with macular edema, left eye: Secondary | ICD-10-CM | POA: Diagnosis not present

## 2021-02-08 ENCOUNTER — Encounter: Payer: Self-pay | Admitting: Internal Medicine

## 2021-02-08 ENCOUNTER — Ambulatory Visit (INDEPENDENT_AMBULATORY_CARE_PROVIDER_SITE_OTHER): Payer: BC Managed Care – PPO | Admitting: Internal Medicine

## 2021-02-08 ENCOUNTER — Other Ambulatory Visit: Payer: Self-pay | Admitting: Internal Medicine

## 2021-02-08 ENCOUNTER — Other Ambulatory Visit: Payer: Self-pay

## 2021-02-08 VITALS — BP 148/80 | HR 75 | Temp 98.4°F | Ht 63.0 in | Wt 155.0 lb

## 2021-02-08 DIAGNOSIS — Z0001 Encounter for general adult medical examination with abnormal findings: Secondary | ICD-10-CM

## 2021-02-08 DIAGNOSIS — Z1159 Encounter for screening for other viral diseases: Secondary | ICD-10-CM

## 2021-02-08 DIAGNOSIS — E1165 Type 2 diabetes mellitus with hyperglycemia: Secondary | ICD-10-CM

## 2021-02-08 DIAGNOSIS — E538 Deficiency of other specified B group vitamins: Secondary | ICD-10-CM | POA: Diagnosis not present

## 2021-02-08 DIAGNOSIS — E559 Vitamin D deficiency, unspecified: Secondary | ICD-10-CM

## 2021-02-08 DIAGNOSIS — Z23 Encounter for immunization: Secondary | ICD-10-CM

## 2021-02-08 DIAGNOSIS — I10 Essential (primary) hypertension: Secondary | ICD-10-CM | POA: Diagnosis not present

## 2021-02-08 DIAGNOSIS — K635 Polyp of colon: Secondary | ICD-10-CM

## 2021-02-08 DIAGNOSIS — E7800 Pure hypercholesterolemia, unspecified: Secondary | ICD-10-CM

## 2021-02-08 LAB — URINALYSIS, ROUTINE W REFLEX MICROSCOPIC
Bilirubin Urine: NEGATIVE
Hgb urine dipstick: NEGATIVE
Ketones, ur: NEGATIVE
Leukocytes,Ua: NEGATIVE
Nitrite: NEGATIVE
Specific Gravity, Urine: 1.025 (ref 1.000–1.030)
Total Protein, Urine: NEGATIVE
Urine Glucose: NEGATIVE
Urobilinogen, UA: 0.2 (ref 0.0–1.0)
pH: 6.5 (ref 5.0–8.0)

## 2021-02-08 LAB — HEMOGLOBIN A1C: Hgb A1c MFr Bld: 9.3 % — ABNORMAL HIGH (ref 4.6–6.5)

## 2021-02-08 LAB — TSH: TSH: 1.84 u[IU]/mL (ref 0.35–5.50)

## 2021-02-08 LAB — CBC WITH DIFFERENTIAL/PLATELET
Basophils Absolute: 0 10*3/uL (ref 0.0–0.1)
Basophils Relative: 0.5 % (ref 0.0–3.0)
Eosinophils Absolute: 0.1 10*3/uL (ref 0.0–0.7)
Eosinophils Relative: 0.9 % (ref 0.0–5.0)
HCT: 33.2 % — ABNORMAL LOW (ref 36.0–46.0)
Hemoglobin: 10.3 g/dL — ABNORMAL LOW (ref 12.0–15.0)
Lymphocytes Relative: 22.2 % (ref 12.0–46.0)
Lymphs Abs: 1.3 10*3/uL (ref 0.7–4.0)
MCHC: 31 g/dL (ref 30.0–36.0)
MCV: 73.5 fl — ABNORMAL LOW (ref 78.0–100.0)
Monocytes Absolute: 0.3 10*3/uL (ref 0.1–1.0)
Monocytes Relative: 5.5 % (ref 3.0–12.0)
Neutro Abs: 4.3 10*3/uL (ref 1.4–7.7)
Neutrophils Relative %: 70.9 % (ref 43.0–77.0)
Platelets: 238 10*3/uL (ref 150.0–400.0)
RBC: 4.51 Mil/uL (ref 3.87–5.11)
RDW: 16.3 % — ABNORMAL HIGH (ref 11.5–15.5)
WBC: 6 10*3/uL (ref 4.0–10.5)

## 2021-02-08 LAB — BASIC METABOLIC PANEL
BUN: 10 mg/dL (ref 6–23)
CO2: 27 mEq/L (ref 19–32)
Calcium: 9.6 mg/dL (ref 8.4–10.5)
Chloride: 104 mEq/L (ref 96–112)
Creatinine, Ser: 0.66 mg/dL (ref 0.40–1.20)
GFR: 88.82 mL/min (ref >60.00)
Glucose, Bld: 184 mg/dL — ABNORMAL HIGH (ref 70–99)
Potassium: 4 mEq/L (ref 3.5–5.1)
Sodium: 141 mEq/L (ref 135–145)

## 2021-02-08 LAB — HEPATIC FUNCTION PANEL
ALT: 12 U/L (ref 0–35)
AST: 17 U/L (ref 0–37)
Albumin: 4.2 g/dL (ref 3.5–5.2)
Alkaline Phosphatase: 67 U/L (ref 39–117)
Bilirubin, Direct: 0.1 mg/dL (ref 0.0–0.3)
Total Bilirubin: 0.8 mg/dL (ref 0.2–1.2)
Total Protein: 7.5 g/dL (ref 6.0–8.3)

## 2021-02-08 LAB — LIPID PANEL
Cholesterol: 241 mg/dL — ABNORMAL HIGH (ref 0–200)
HDL: 78.7 mg/dL (ref >39.00)
LDL Cholesterol: 150 mg/dL — ABNORMAL HIGH (ref 0–99)
NonHDL: 162.11
Total CHOL/HDL Ratio: 3
Triglycerides: 59 mg/dL (ref 0.0–149.0)
VLDL: 11.8 mg/dL (ref 0.0–40.0)

## 2021-02-08 LAB — VITAMIN D 25 HYDROXY (VIT D DEFICIENCY, FRACTURES): VITD: 30.45 ng/mL (ref 30.00–100.00)

## 2021-02-08 LAB — VITAMIN B12: Vitamin B-12: 676 pg/mL (ref 211–911)

## 2021-02-08 LAB — MICROALBUMIN / CREATININE URINE RATIO
Creatinine,U: 124.2 mg/dL
Microalb Creat Ratio: 4.4 mg/g (ref 0.0–30.0)
Microalb, Ur: 5.5 mg/dL — ABNORMAL HIGH (ref 0.0–1.9)

## 2021-02-08 MED ORDER — EZETIMIBE 10 MG PO TABS: 10.0000 mg | ORAL_TABLET | Freq: Every day | ORAL | 3 refills | Status: DC

## 2021-02-08 MED ORDER — METFORMIN HCL ER 500 MG PO TB24: 1000.0000 mg | ORAL_TABLET | Freq: Every day | ORAL | 3 refills | Status: DC

## 2021-02-08 NOTE — Patient Instructions (Addendum)
You had the flu shot today  Please continue all other medications as before, and refills have been done if requested.  Please have the pharmacy call with any other refills you may need.  Please continue your efforts at being more active, low cholesterol diet, and weight control.  You are otherwise up to date with prevention measures today.  Please keep your appointments with your specialists as you may have planned  .Please go to the LAB at the blood drawing area for the tests to be done  You will be contacted by phone if any changes need to be made immediately.  Otherwise, you will receive a letter about your results with an explanation, but please check with MyChart first.  Please remember to sign up for MyChart if you have not done so, as this will be important to you in the future with finding out test results, communicating by private email, and scheduling acute appointments online when needed.  Please make an Appointment to return in 6 months, or sooner if needed  

## 2021-02-08 NOTE — Progress Notes (Signed)
Patient ID: Sarah Berger, female   DOB: 10-27-1950, 70 y.o.   MRN: [id number]         Chief Complaint:: wellness exam and elevted BPdm, hld        HPI:  Sarah Berger is a 70 y.o. female here for wellness exam; declines covid booster, shingrix, pneumovax, mammogram o/w up to date except for flu shot today                        Also was seeing endo Dr Sarah Berger but copays became too expensive  Also seeing retinal specialist for Dm retinal dz getting shots, next f/u Mar 02, 2021 and she feels her vision is improving.  Still working for [employer] but plans to leave soon with a severence package may 2023.  Pt denies chest pain, increased sob or doe, wheezing, orthopnea, PND, increased LE swelling, palpitations, dizziness or syncope.   Pt denies polydipsia, polyuria, or low sugar symptoms .  No new focal neuro s/s.  BP has been < 14090 at home but not check recently, does not want med change for now        Wt Readings from Last 3 Encounters:  02/08/21 155 lb (70.3 kg)  04/16/18 147 lb 3.2 oz (66.8 kg)  02/12/18 152 lb 12.8 oz (69.3 kg)   BP Readings from Last 3 Encounters:  02/08/21 (!) 148/80  04/16/18 130/70  02/12/18 (!) 170/88   Immunization History  Administered Date(s) Administered   Fluad Quad(high Dose 65+) 02/08/2021   Pneumococcal Conjugate-13 08/04/2017   Pneumococcal Polysaccharide-23 03/18/2014   Tdap 10/25/2012   There are no preventive care reminders to display for this patient.     Past Medical History:  Diagnosis Date   Anemia    Hx of    Anemia, unspecified 10/23/2012   Colon cancer ([place])    Diabetes mellitus without complication ([place])    Patient denies   Hematochezia 06/21/2011   HTN (hypertension) 06/21/2011   Several mild readings in the past, has decline med tx   Hx of colonic polyp 10/08/1996   Colonoscopy-Dr. Sammuel Cooper    Hyperlipidemia 06/21/2011   Impaired glucose tolerance 06/21/2011   Obesity 06/21/2011   Past Surgical History:  Procedure Laterality Date    ABDOMINAL HYSTERECTOMY  1995   bone surgery feet  1993   bone spurs   HEMICOLECTOMY  10/2011    reports that she quit smoking about 36 years ago. Her smoking use included cigarettes. She has never used smokeless tobacco. She reports that she does not drink alcohol and does not use drugs. family history includes Colon polyps in her mother; Hypertension in her mother. Allergies  Allergen Reactions   Crestor [Rosuvastatin] Other (See Comments)    myalgia   Influenza Vaccines    Lipitor [Atorvastatin] Other (See Comments)    myalgia   Metformin And Related Diarrhea   Current Outpatient Medications on File Prior to Visit  Medication Sig Dispense Refill   Biotin 10 MG CAPS Take by mouth daily.     Ca Carbonate-Mag Hydroxide 550-110 MG CHEW Chew by mouth.     cholecalciferol (VITAMIN D) 400 UNITS TABS Take by mouth.     CHROMIUM PICOLINATE PO Take by mouth every morning.     Cinnamon 500 MG capsule Take by mouth daily.     CONTOUR NEXT TEST test strip USE AS DIRECTED TO TEST BLOOD SUGAR TWICE DAILY 100 strip 3   Fe Bisgly-Vit C-Vit B12-FA (  GENTLE IRON PO) Take by mouth daily.     fish oil-omega-3 fatty acids 1000 MG capsule Take 1 g by mouth daily.     Krill Oil 1000 MG CAPS Take by mouth daily.     Microlet Lancets MISC USE AS DIRECTED TO TEST BLOOD SUGAR TWICE DAILY 100 each 3   vitamin B-12 (CYANOCOBALAMIN) 100 MCG tablet Take 50 mcg by mouth daily.     SPIRULINA PO Take by mouth daily. (Patient not taking: Reported on 02/08/2021)     No current facility-administered medications on file prior to visit.        ROS:  All others reviewed and negative.  Objective        PE:  BP (!) 148/80 (BP Location: Left Arm, Patient Position: Sitting, Cuff Size: Normal)   Pulse 75   Temp 98.4 F (36.9 C) (Oral)   Ht 5\' 3"  (1.6 m)   Wt 155 lb (70.3 kg)   LMP 03/01/1987 (Approximate)   SpO2 99%   BMI 27.46 kg/m                 Constitutional: Pt appears in NAD               HENT: Head: NCAT.                 Right Ear: External ear normal.                 Left Ear: External ear normal.                Eyes: . Pupils are equal, round, and reactive to light. Conjunctivae and EOM are normal               Nose: without d/c or deformity               Neck: Neck supple. Gross normal ROM               Cardiovascular: Normal rate and regular rhythm.                 Pulmonary/Chest: Effort normal and breath sounds without rales or wheezing.                Abd:  Soft, NT, ND, + BS, no organomegaly               Neurological: Pt is alert. At baseline orientation, motor grossly intact               Skin: Skin is warm. No rashes, no other new lesions, LE edema - none               Psychiatric: Pt behavior is normal without agitation   Micro: none  Cardiac tracings I have personally interpreted today:  none  Pertinent Radiological findings (summarize): none   Lab Results  Component Value Date   WBC 6.0 02/08/2021   HGB 10.3 (L) 02/08/2021   HCT 33.2 (L) 02/08/2021   PLT 238.0 02/08/2021   GLUCOSE 184 (H) 02/08/2021   CHOL 241 (H) 02/08/2021   TRIG 59.0 02/08/2021   HDL 78.70 02/08/2021   LDLDIRECT 150.9 10/23/2012   LDLCALC 150 (H) 02/08/2021   ALT 12 02/08/2021   AST 17 02/08/2021   NA 141 02/08/2021   K 4.0 02/08/2021   CL 104 02/08/2021   CREATININE 0.66 02/08/2021   BUN 10 02/08/2021   CO2 27 02/08/2021   TSH 1.84 02/08/2021   HGBA1C  9.3 (H) 02/08/2021   MICROALBUR 5.5 (H) 02/08/2021   Assessment/Plan:  Sarah Berger is a 70 y.o. Black or African American [2] female with  has a past medical history of Anemia, Anemia, unspecified (10/23/2012), Colon cancer ([place]), Diabetes mellitus without complication ([place]), Hematochezia (06/21/2011), HTN (hypertension) (06/21/2011), colonic polyp (10/08/1996), Hyperlipidemia (06/21/2011), Impaired glucose tolerance (06/21/2011), and Obesity (06/21/2011).  Encounter for well adult exam with abnormal findings Age and sex appropriate education  and counseling updated with regular exercise and diet Referrals for preventative services - declines mammogram Immunizations addressed - declines covid booster, shingrix, pneumovax, for flu shot today Smoking counseling  - none needed Evidence for depression or other mood disorder - none significant Most recent labs reviewed. I have personally reviewed and have noted: 1) the patient's medical and social history 2) The patient's current medications and supplements 3) The patient's height, weight, and BMI have been recorded in the chart   Diabetes Lab Results  Component Value Date   HGBA1C 9.3 (H) 02/08/2021   Uncontrolled, to start actos 45 qd,, pt to continue DM diet   Hyperlipidemia Lab Results  Component Value Date   LDLCALC 150 (H) 02/08/2021   Stable, pt to start zetia as has been statin intolerant, DM low chol diet   HTN (hypertension) Remains mild elevated uncontrolled today, similar for many years, to montior BP at home, still declines BP med tx at this time, for low salt, wt control  Followup: Return in about 6 months (around 08/09/2021).  Cathlean Cower, MD 02/11/2021 10:16 PM [facility]

## 2021-02-09 LAB — HEPATITIS C ANTIBODY
Hepatitis C Ab: NONREACTIVE
SIGNAL TO CUT-OFF: 0.04 (ref <1.00)

## 2021-02-10 ENCOUNTER — Other Ambulatory Visit: Payer: Self-pay | Admitting: Internal Medicine

## 2021-02-10 MED ORDER — PIOGLITAZONE HCL 45 MG PO TABS: 45.0000 mg | ORAL_TABLET | Freq: Every day | ORAL | 3 refills | Status: DC

## 2021-02-10 NOTE — Telephone Encounter (Signed)
Ok since the metformin causes diarrhea - ok to not take this  Please take all new medication as prescribed- the actos 45 mg daily - done erx  Staff to please inform pt

## 2021-02-11 ENCOUNTER — Encounter: Payer: Self-pay | Admitting: Internal Medicine

## 2021-02-11 NOTE — Assessment & Plan Note (Signed)
Lab Results  Component Value Date   HGBA1C 9.3 (H) 02/08/2021   Uncontrolled, to start actos 45 qd,, pt to continue DM diet

## 2021-02-11 NOTE — Assessment & Plan Note (Signed)
Lab Results  Component Value Date   LDLCALC 150 (H) 02/08/2021   Stable, pt to start zetia as has been statin intolerant, DM low chol diet

## 2021-02-11 NOTE — Telephone Encounter (Signed)
Patient notified and verbalizes understanding.

## 2021-02-11 NOTE — Assessment & Plan Note (Signed)
Remains mild elevated uncontrolled today, similar for many years, to montior BP at home, still declines BP med tx at this time, for low salt, wt control

## 2021-02-11 NOTE — Assessment & Plan Note (Addendum)
Age and sex appropriate education and counseling updated with regular exercise and diet Referrals for preventative services - declines mammogram Immunizations addressed - declines covid booster, shingrix, pneumovax, for flu shot today Smoking counseling  - none needed Evidence for depression or other mood disorder - none significant Most recent labs reviewed. I have personally reviewed and have noted: 1) the patient's medical and social history 2) The patient's current medications and supplements 3) The patient's height, weight, and BMI have been recorded in the chart

## 2021-03-02 DIAGNOSIS — H43821 Vitreomacular adhesion, right eye: Secondary | ICD-10-CM | POA: Diagnosis not present

## 2021-03-02 DIAGNOSIS — E113311 Type 2 diabetes mellitus with moderate nonproliferative diabetic retinopathy with macular edema, right eye: Secondary | ICD-10-CM | POA: Diagnosis not present

## 2021-03-02 DIAGNOSIS — E113313 Type 2 diabetes mellitus with moderate nonproliferative diabetic retinopathy with macular edema, bilateral: Secondary | ICD-10-CM | POA: Diagnosis not present

## 2021-03-30 DIAGNOSIS — H43821 Vitreomacular adhesion, right eye: Secondary | ICD-10-CM | POA: Diagnosis not present

## 2021-03-30 DIAGNOSIS — H43812 Vitreous degeneration, left eye: Secondary | ICD-10-CM | POA: Diagnosis not present

## 2021-03-30 DIAGNOSIS — E113312 Type 2 diabetes mellitus with moderate nonproliferative diabetic retinopathy with macular edema, left eye: Secondary | ICD-10-CM | POA: Diagnosis not present

## 2021-03-30 DIAGNOSIS — E113313 Type 2 diabetes mellitus with moderate nonproliferative diabetic retinopathy with macular edema, bilateral: Secondary | ICD-10-CM | POA: Diagnosis not present

## 2021-03-31 ENCOUNTER — Telehealth: Payer: Self-pay | Admitting: Internal Medicine

## 2021-03-31 MED ORDER — MICROLET LANCETS MISC: 3 refills | Status: AC

## 2021-03-31 MED ORDER — CONTOUR NEXT TEST VI STRP: ORAL_STRIP | 3 refills | Status: AC

## 2021-03-31 NOTE — Telephone Encounter (Signed)
1.Medication Requested:  Microlet Lancets MISC  Contour Next Test Strips  2. Pharmacy (Name, [place], [facility]): [facility], [place]  Phone:  [phone] Fax:  [phone]   3. On Med List: yes  4. Last Visit with PCP: 12.12.22  5. Next visit date with PCP: 06.13.23   Agent: Please be advised that RX refills may take up to 3 business days. We ask that you follow-up with your pharmacy.

## 2021-04-01 ENCOUNTER — Ambulatory Visit: Payer: BC Managed Care – PPO | Admitting: Internal Medicine

## 2021-04-05 ENCOUNTER — Telehealth: Payer: Self-pay | Admitting: Internal Medicine

## 2021-04-05 NOTE — Telephone Encounter (Signed)
Pt called stating pharmacy did not have refill request for glucose blood (CONTOUR NEXT TEST) test strip  Called pharmacy advised refill request was sent 03-31-2021  Pharmacy stated medication is not covered by insurance and will need PA  Called and advised pt, pt expressed understanding and requesting PA

## 2021-04-06 ENCOUNTER — Other Ambulatory Visit: Payer: Self-pay | Admitting: Internal Medicine

## 2021-04-06 DIAGNOSIS — Z1231 Encounter for screening mammogram for malignant neoplasm of breast: Secondary | ICD-10-CM

## 2021-04-07 ENCOUNTER — Other Ambulatory Visit: Payer: Self-pay

## 2021-04-07 ENCOUNTER — Ambulatory Visit
Admission: RE | Admit: 2021-04-07 | Discharge: 2021-04-07 | Disposition: A | Discharge status: Home / Self Care | Payer: BC Managed Care – PPO | Source: Ambulatory Visit | Attending: Internal Medicine | Admitting: Internal Medicine

## 2021-04-07 DIAGNOSIS — Z1231 Encounter for screening mammogram for malignant neoplasm of breast: Secondary | ICD-10-CM

## 2021-04-07 NOTE — Telephone Encounter (Signed)
PA sent to plan.  Key: [id number]

## 2021-04-27 DIAGNOSIS — E113312 Type 2 diabetes mellitus with moderate nonproliferative diabetic retinopathy with macular edema, left eye: Secondary | ICD-10-CM | POA: Diagnosis not present

## 2021-04-27 DIAGNOSIS — E113313 Type 2 diabetes mellitus with moderate nonproliferative diabetic retinopathy with macular edema, bilateral: Secondary | ICD-10-CM | POA: Diagnosis not present

## 2021-04-27 DIAGNOSIS — H31092 Other chorioretinal scars, left eye: Secondary | ICD-10-CM | POA: Diagnosis not present

## 2021-04-27 DIAGNOSIS — H43823 Vitreomacular adhesion, bilateral: Secondary | ICD-10-CM | POA: Diagnosis not present

## 2021-04-27 DIAGNOSIS — H35033 Hypertensive retinopathy, bilateral: Secondary | ICD-10-CM | POA: Diagnosis not present

## 2021-05-25 DIAGNOSIS — E113312 Type 2 diabetes mellitus with moderate nonproliferative diabetic retinopathy with macular edema, left eye: Secondary | ICD-10-CM | POA: Diagnosis not present

## 2021-05-25 DIAGNOSIS — E113313 Type 2 diabetes mellitus with moderate nonproliferative diabetic retinopathy with macular edema, bilateral: Secondary | ICD-10-CM | POA: Diagnosis not present

## 2021-06-25 DIAGNOSIS — E113313 Type 2 diabetes mellitus with moderate nonproliferative diabetic retinopathy with macular edema, bilateral: Secondary | ICD-10-CM | POA: Diagnosis not present

## 2021-06-25 DIAGNOSIS — E113312 Type 2 diabetes mellitus with moderate nonproliferative diabetic retinopathy with macular edema, left eye: Secondary | ICD-10-CM | POA: Diagnosis not present

## 2021-07-23 DIAGNOSIS — H35033 Hypertensive retinopathy, bilateral: Secondary | ICD-10-CM | POA: Diagnosis not present

## 2021-07-23 DIAGNOSIS — E113312 Type 2 diabetes mellitus with moderate nonproliferative diabetic retinopathy with macular edema, left eye: Secondary | ICD-10-CM | POA: Diagnosis not present

## 2021-07-23 DIAGNOSIS — E113313 Type 2 diabetes mellitus with moderate nonproliferative diabetic retinopathy with macular edema, bilateral: Secondary | ICD-10-CM | POA: Diagnosis not present

## 2021-07-23 DIAGNOSIS — H43823 Vitreomacular adhesion, bilateral: Secondary | ICD-10-CM | POA: Diagnosis not present

## 2021-07-23 DIAGNOSIS — H31092 Other chorioretinal scars, left eye: Secondary | ICD-10-CM | POA: Diagnosis not present

## 2021-08-10 ENCOUNTER — Encounter: Payer: Self-pay | Admitting: Internal Medicine

## 2021-08-10 ENCOUNTER — Ambulatory Visit: Payer: BC Managed Care – PPO | Admitting: Internal Medicine

## 2021-08-10 ENCOUNTER — Other Ambulatory Visit: Payer: Self-pay | Admitting: Internal Medicine

## 2021-08-10 VITALS — BP 134/80 | HR 69 | Temp 98.1°F | Ht 63.0 in | Wt 154.8 lb

## 2021-08-10 DIAGNOSIS — E119 Type 2 diabetes mellitus without complications: Secondary | ICD-10-CM | POA: Diagnosis not present

## 2021-08-10 DIAGNOSIS — E559 Vitamin D deficiency, unspecified: Secondary | ICD-10-CM | POA: Diagnosis not present

## 2021-08-10 DIAGNOSIS — I10 Essential (primary) hypertension: Secondary | ICD-10-CM | POA: Diagnosis not present

## 2021-08-10 DIAGNOSIS — R3129 Other microscopic hematuria: Secondary | ICD-10-CM

## 2021-08-10 DIAGNOSIS — E538 Deficiency of other specified B group vitamins: Secondary | ICD-10-CM

## 2021-08-10 DIAGNOSIS — R922 Inconclusive mammogram: Secondary | ICD-10-CM

## 2021-08-10 DIAGNOSIS — Z0001 Encounter for general adult medical examination with abnormal findings: Secondary | ICD-10-CM

## 2021-08-10 DIAGNOSIS — E7800 Pure hypercholesterolemia, unspecified: Secondary | ICD-10-CM

## 2021-08-10 LAB — URINALYSIS, ROUTINE W REFLEX MICROSCOPIC
Bilirubin Urine: NEGATIVE
Ketones, ur: NEGATIVE
Nitrite: NEGATIVE
Specific Gravity, Urine: 1.02 (ref 1.000–1.030)
Total Protein, Urine: NEGATIVE
Urine Glucose: NEGATIVE
Urobilinogen, UA: 0.2 (ref 0.0–1.0)
pH: 6 (ref 5.0–8.0)

## 2021-08-10 LAB — BASIC METABOLIC PANEL
BUN: 8 mg/dL (ref 6–23)
CO2: 28 mEq/L (ref 19–32)
Calcium: 9.6 mg/dL (ref 8.4–10.5)
Chloride: 103 mEq/L (ref 96–112)
Creatinine, Ser: 0.74 mg/dL (ref 0.40–1.20)
GFR: 81.64 mL/min (ref >60.00)
Glucose, Bld: 192 mg/dL — ABNORMAL HIGH (ref 70–99)
Potassium: 3.9 mEq/L (ref 3.5–5.1)
Sodium: 139 mEq/L (ref 135–145)

## 2021-08-10 LAB — CBC WITH DIFFERENTIAL/PLATELET
Basophils Absolute: 0 10*3/uL (ref 0.0–0.1)
Basophils Relative: 0.6 % (ref 0.0–3.0)
Eosinophils Absolute: 0 10*3/uL (ref 0.0–0.7)
Eosinophils Relative: 0.7 % (ref 0.0–5.0)
HCT: 34.8 % — ABNORMAL LOW (ref 36.0–46.0)
Hemoglobin: 11 g/dL — ABNORMAL LOW (ref 12.0–15.0)
Lymphocytes Relative: 19.2 % (ref 12.0–46.0)
Lymphs Abs: 1.3 10*3/uL (ref 0.7–4.0)
MCHC: 31.6 g/dL (ref 30.0–36.0)
MCV: 73.7 fl — ABNORMAL LOW (ref 78.0–100.0)
Monocytes Absolute: 0.4 10*3/uL (ref 0.1–1.0)
Monocytes Relative: 5.4 % (ref 3.0–12.0)
Neutro Abs: 5.1 10*3/uL (ref 1.4–7.7)
Neutrophils Relative %: 74.1 % (ref 43.0–77.0)
Platelets: 197 10*3/uL (ref 150.0–400.0)
RBC: 4.72 Mil/uL (ref 3.87–5.11)
RDW: 15.4 % (ref 11.5–15.5)
WBC: 6.9 10*3/uL (ref 4.0–10.5)

## 2021-08-10 LAB — MICROALBUMIN / CREATININE URINE RATIO
Creatinine,U: 167.3 mg/dL
Microalb Creat Ratio: 3.3 mg/g (ref 0.0–30.0)
Microalb, Ur: 5.6 mg/dL — ABNORMAL HIGH (ref 0.0–1.9)

## 2021-08-10 LAB — HEPATIC FUNCTION PANEL
ALT: 16 U/L (ref 0–35)
AST: 17 U/L (ref 0–37)
Albumin: 4.4 g/dL (ref 3.5–5.2)
Alkaline Phosphatase: 66 U/L (ref 39–117)
Bilirubin, Direct: 0.1 mg/dL (ref 0.0–0.3)
Total Bilirubin: 0.9 mg/dL (ref 0.2–1.2)
Total Protein: 7.5 g/dL (ref 6.0–8.3)

## 2021-08-10 LAB — LIPID PANEL
Cholesterol: 274 mg/dL — ABNORMAL HIGH (ref 0–200)
HDL: 74.9 mg/dL (ref >39.00)
LDL Cholesterol: 175 mg/dL — ABNORMAL HIGH (ref 0–99)
NonHDL: 199.42
Total CHOL/HDL Ratio: 4
Triglycerides: 121 mg/dL (ref 0.0–149.0)
VLDL: 24.2 mg/dL (ref 0.0–40.0)

## 2021-08-10 LAB — TSH: TSH: 2.03 u[IU]/mL (ref 0.35–5.50)

## 2021-08-10 LAB — VITAMIN B12: Vitamin B-12: 734 pg/mL (ref 211–911)

## 2021-08-10 LAB — VITAMIN D 25 HYDROXY (VIT D DEFICIENCY, FRACTURES): VITD: 36.85 ng/mL (ref 30.00–100.00)

## 2021-08-10 LAB — HEMOGLOBIN A1C: Hgb A1c MFr Bld: 8.8 % — ABNORMAL HIGH (ref 4.6–6.5)

## 2021-08-10 MED ORDER — METFORMIN HCL ER 500 MG PO TB24: 500.0000 mg | ORAL_TABLET | Freq: Every day | ORAL | 3 refills | Status: DC

## 2021-08-10 MED ORDER — EZETIMIBE 10 MG PO TABS: 10.0000 mg | ORAL_TABLET | Freq: Every day | ORAL | 3 refills | Status: AC

## 2021-08-10 MED ORDER — OZEMPIC (0.25 OR 0.5 MG/DOSE) 2 MG/3ML ~~LOC~~ SOPN: PEN_INJECTOR | SUBCUTANEOUS | 3 refills | Status: DC

## 2021-08-10 NOTE — Progress Notes (Signed)
Patient ID: Sarah Berger, female   DOB: 10-16-1950, 71 y.o.   MRN: [id number]         Chief Complaint:: wellness exam and dense breasts dm, overwt, hld, htn       HPI:  Sarah Berger is a 71 y.o. female here for wellness exam; decliens covid boostser, shingrix, pneumovax, o/w up to date                        Also pt asks for breast MRI as routine mammogram mentions unable to r/o underlying mass due to dense breasts.  Pt denies chest pain, increased sob or doe, wheezing, orthopnea, PND, increased LE swelling, palpitations, dizziness or syncope.   Pt denies polydipsia, polyuria, or new focal neuro s/s.    Pt denies fever, wt loss, night sweats, loss of appetite, or other constitutional symptoms  Has been gaining wt recently due to less active, despite taking metformin.     Wt Readings from Last 3 Encounters:  08/10/21 154 lb 12.8 oz (70.2 kg)  02/08/21 155 lb (70.3 kg)  04/16/18 147 lb 3.2 oz (66.8 kg)   BP Readings from Last 3 Encounters:  08/10/21 134/80  02/08/21 (!) 148/80  04/16/18 130/70   Immunization History  Administered Date(s) Administered   Fluad Quad(high Dose 65+) 02/08/2021   Pneumococcal Conjugate-13 08/04/2017   Pneumococcal Polysaccharide-23 03/18/2014   Tdap 10/25/2012   There are no preventive care reminders to display for this patient.     Past Medical History:  Diagnosis Date   Anemia    Hx of    Anemia, unspecified 10/23/2012   Colon cancer ([place])    Diabetes mellitus without complication ([place])    Patient denies   Hematochezia 06/21/2011   HTN (hypertension) 06/21/2011   Several mild readings in the past, has decline med tx   Hx of colonic polyp 10/08/1996   Colonoscopy-Dr. Sammuel Cooper    Hyperlipidemia 06/21/2011   Impaired glucose tolerance 06/21/2011   Obesity 06/21/2011   Past Surgical History:  Procedure Laterality Date   ABDOMINAL HYSTERECTOMY  1995   bone surgery feet  1993   bone spurs   HEMICOLECTOMY  10/2011    reports that she quit smoking  about 36 years ago. Her smoking use included cigarettes. She has never used smokeless tobacco. She reports that she does not drink alcohol and does not use drugs. family history includes Colon polyps in her mother; Hypertension in her mother. Allergies  Allergen Reactions   Crestor [Rosuvastatin] Other (See Comments)    myalgia   Influenza Vaccines    Lipitor [Atorvastatin] Other (See Comments)    myalgia   Metformin And Related Diarrhea   Current Outpatient Medications on File Prior to Visit  Medication Sig Dispense Refill   Biotin 10 MG CAPS Take by mouth daily.     Ca Carbonate-Mag Hydroxide 550-110 MG CHEW Chew by mouth.     cholecalciferol (VITAMIN D) 400 UNITS TABS Take by mouth.     CHROMIUM PICOLINATE PO Take by mouth every morning.     Cinnamon 500 MG capsule Take by mouth daily.     Fe Bisgly-Vit C-Vit B12-FA (GENTLE IRON PO) Take by mouth daily.     fish oil-omega-3 fatty acids 1000 MG capsule Take 1 g by mouth daily.     glucose blood (CONTOUR NEXT TEST) test strip USE AS DIRECTED TO TEST BLOOD SUGAR TWICE DAILY 100 strip 3   Krill Oil 1000 MG  CAPS Take by mouth daily.     Microlet Lancets MISC USE AS DIRECTED TO TEST BLOOD SUGAR TWICE DAILY 100 each 3   vitamin B-12 (CYANOCOBALAMIN) 100 MCG tablet Take 50 mcg by mouth daily.     SPIRULINA PO Take by mouth daily. (Patient not taking: Reported on 02/08/2021)     No current facility-administered medications on file prior to visit.        ROS:  All others reviewed and negative.  Objective        PE:  BP 134/80 (BP Location: Left Arm, Patient Position: Sitting, Cuff Size: Normal)   Pulse 69   Temp 98.1 F (36.7 C) (Oral)   Ht '5\' 3"'$  (1.6 m)   Wt 154 lb 12.8 oz (70.2 kg)   LMP 03/01/1987 (Approximate)   SpO2 97%   BMI 27.42 kg/m                 Constitutional: Pt appears in NAD               HENT: Head: NCAT.                Right Ear: External ear normal.                 Left Ear: External ear normal.                 Eyes: . Pupils are equal, round, and reactive to light. Conjunctivae and EOM are normal               Nose: without d/c or deformity               Neck: Neck supple. Gross normal ROM               Cardiovascular: Normal rate and regular rhythm.                 Pulmonary/Chest: Effort normal and breath sounds without rales or wheezing.                Abd:  Soft, NT, ND, + BS, no organomegaly               Neurological: Pt is alert. At baseline orientation, motor grossly intact               Skin: Skin is warm. No rashes, no other new lesions, LE edema - none               Psychiatric: Pt behavior is normal without agitation   Micro: none  Cardiac tracings I have personally interpreted today:  none  Pertinent Radiological findings (summarize): none   Lab Results  Component Value Date   WBC 6.9 08/10/2021   HGB 11.0 (L) 08/10/2021   HCT 34.8 (L) 08/10/2021   PLT 197.0 08/10/2021   GLUCOSE 192 (H) 08/10/2021   CHOL 274 (H) 08/10/2021   TRIG 121.0 08/10/2021   HDL 74.90 08/10/2021   LDLDIRECT 150.9 10/23/2012   LDLCALC 175 (H) 08/10/2021   ALT 16 08/10/2021   AST 17 08/10/2021   NA 139 08/10/2021   K 3.9 08/10/2021   CL 103 08/10/2021   CREATININE 0.74 08/10/2021   BUN 8 08/10/2021   CO2 28 08/10/2021   TSH 2.03 08/10/2021   HGBA1C 8.8 (H) 08/10/2021   MICROALBUR 5.6 (H) 08/10/2021   Assessment/Plan:  Sarah Berger is a 71 y.o. Black or African American [2] female with  has a past  medical history of Anemia, Anemia, unspecified (10/23/2012), Colon cancer ([place]), Diabetes mellitus without complication ([place]), Hematochezia (06/21/2011), HTN (hypertension) (06/21/2011), colonic polyp (10/08/1996), Hyperlipidemia (06/21/2011), Impaired glucose tolerance (06/21/2011), and Obesity (06/21/2011).  Encounter for well adult exam with abnormal findings Age and sex appropriate education and counseling updated with regular exercise and diet Referrals for preventative services - none  needed Immunizations addressed - declines covid booster, shingrix, pneumovax Smoking counseling  - none needed Evidence for depression or other mood disorder - none significant Most recent labs reviewed. I have personally reviewed and have noted: 1) the patient's medical and social history 2) The patient's current medications and supplements 3) The patient's height, weight, and BMI have been recorded in the chart   Type 2 diabetes mellitus without complication, without long-term current use of insulin (HCC) Lab Results  Component Value Date   HGBA1C 8.8 (H) 08/10/2021   Uncontrolled, has been gaining wt, pt to continue current medical treatment metformin ER 500 qd, but also add ozempic 0.5 for sugar and wt control         Hyperlipidemia Lab Results  Component Value Date   LDLCALC 175 (H) 08/10/2021   Severe uncontrolled, pt to restart zetia 10 qd as has been statin intolerant in past   HTN (hypertension) BP Readings from Last 3 Encounters:  08/10/21 134/80  02/08/21 (!) 148/80  04/16/18 130/70   Stable, pt to continue medical treatment - diet, wt control, low salt, declines add ARB or other for now   Dense breasts Ok to defer to pt request - for MRI breast if ok with insurance  Vitamin D deficiency Last vitamin D Lab Results  Component Value Date   VD25OH 36.85 08/10/2021   Low to start oral replacement   Followup: Return in about 6 months (around 02/09/2022).  Cathlean Cower, MD 08/14/2021 3:00 PM [facility]

## 2021-08-10 NOTE — Patient Instructions (Addendum)
Please consider the Shingrix shingles shot to be done at your local pharmacy  Please take all new medication as prescribed  - the ozempic weekly shots  Please continue all other medications as before, including the metformin ER 500 mg per day  Please have the pharmacy call with any other refills you may need.  Please continue your efforts at being more active, low cholesterol diet, and weight control.  You are otherwise up to date with prevention measures today.  Please keep your appointments with your specialists as you may have planned  You will be contacted regarding the referral for: MRI breast  Please go to the LAB at the blood drawing area for the tests to be done  You will be contacted by phone if any changes need to be made immediately.  Otherwise, you will receive a letter about your results with an explanation, but please check with MyChart first.  Please remember to sign up for MyChart if you have not done so, as this will be important to you in the future with finding out test results, communicating by private email, and scheduling acute appointments online when needed.  Please make an Appointment to return in 6 months, or sooner if needed

## 2021-08-14 ENCOUNTER — Encounter: Payer: Self-pay | Admitting: Internal Medicine

## 2021-08-14 DIAGNOSIS — E559 Vitamin D deficiency, unspecified: Secondary | ICD-10-CM | POA: Insufficient documentation

## 2021-08-14 NOTE — Assessment & Plan Note (Signed)
Lab Results  Component Value Date   HGBA1C 8.8 (H) 08/10/2021   Uncontrolled, has been gaining wt, pt to continue current medical treatment metformin ER 500 qd, but also add ozempic 0.5 for sugar and wt control

## 2021-08-14 NOTE — Assessment & Plan Note (Signed)
Lab Results  Component Value Date   LDLCALC 175 (H) 08/10/2021   Severe uncontrolled, pt to restart zetia 10 qd as has been statin intolerant in past

## 2021-08-14 NOTE — Assessment & Plan Note (Signed)
Age and sex appropriate education and counseling updated with regular exercise and diet Referrals for preventative services - none needed Immunizations addressed - declines covid booster, shingrix, pneumovax Smoking counseling  - none needed Evidence for depression or other mood disorder - none significant Most recent labs reviewed. I have personally reviewed and have noted: 1) the patient's medical and social history 2) The patient's current medications and supplements 3) The patient's height, weight, and BMI have been recorded in the chart  

## 2021-08-14 NOTE — Assessment & Plan Note (Signed)
Ok to defer to pt request - for MRI breast if ok with insurance

## 2021-08-14 NOTE — Assessment & Plan Note (Signed)
Last vitamin D Lab Results  Component Value Date   VD25OH 36.85 08/10/2021   Low to start oral replacement

## 2021-08-14 NOTE — Assessment & Plan Note (Signed)
BP Readings from Last 3 Encounters:  08/10/21 134/80  02/08/21 (!) 148/80  04/16/18 130/70   Stable, pt to continue medical treatment - diet, wt control, low salt, declines add ARB or other for now

## 2021-08-20 ENCOUNTER — Encounter: Payer: Self-pay | Admitting: Internal Medicine

## 2021-08-20 ENCOUNTER — Ambulatory Visit: Payer: BC Managed Care – PPO | Admitting: Internal Medicine

## 2021-08-20 VITALS — BP 152/68 | HR 86 | Temp 99.0°F | Ht 63.0 in | Wt 156.6 lb

## 2021-08-20 DIAGNOSIS — R9230 Dense breasts, unspecified: Secondary | ICD-10-CM

## 2021-08-20 DIAGNOSIS — E119 Type 2 diabetes mellitus without complications: Secondary | ICD-10-CM | POA: Diagnosis not present

## 2021-08-20 DIAGNOSIS — R413 Other amnesia: Secondary | ICD-10-CM

## 2021-08-20 DIAGNOSIS — I10 Essential (primary) hypertension: Secondary | ICD-10-CM

## 2021-08-20 DIAGNOSIS — R922 Inconclusive mammogram: Secondary | ICD-10-CM

## 2021-08-20 DIAGNOSIS — E559 Vitamin D deficiency, unspecified: Secondary | ICD-10-CM | POA: Diagnosis not present

## 2021-08-20 DIAGNOSIS — R3129 Other microscopic hematuria: Secondary | ICD-10-CM

## 2021-08-20 DIAGNOSIS — E7800 Pure hypercholesterolemia, unspecified: Secondary | ICD-10-CM

## 2021-08-20 DIAGNOSIS — F419 Anxiety disorder, unspecified: Secondary | ICD-10-CM

## 2021-08-20 NOTE — Progress Notes (Signed)
Patient ID: Sarah Berger, female   DOB: 12-01-1950, 71 y.o.   MRN: [id number]        Chief Complaint: follow up HTN, DM, memory changes, microhematuria       HPI:  Sarah Berger is a 71 y.o. female here with c/o new onset memory loss, but woke up June 16 with not knowing certain things she had known like Password for her ATM, and recalling names of a few church related persons she thought she should know to include on email.  Now learned new Password PIN number. Denies other memory changs, does not want MRI, asa, or other med changes for now    Urged by her optho to come here. Getting eye injection Vabysmo per Dr Awanda Mink optho for macular degeneration, has f/u July 24, vision seems improved already.  Pt denies chest pain, increased sob or doe, wheezing, orthopnea, PND, increased LE swelling, palpitations, dizziness or syncope.  Pt denies polydipsia, polyuria, or new focal neuro s/s.  Denies urinary symptoms such as dysuria, frequency, urgency, flank pain, hematuria or n/v, fever, chills.         Wt Readings from Last 3 Encounters:  08/20/21 156 lb 9.6 oz (71 kg)  08/10/21 154 lb 12.8 oz (70.2 kg)  02/08/21 155 lb (70.3 kg)   BP Readings from Last 3 Encounters:  08/20/21 (!) 152/68  08/10/21 134/80  02/08/21 (!) 148/80         Past Medical History:  Diagnosis Date   Anemia    Hx of    Anemia, unspecified 10/23/2012   Colon cancer ([place])    Diabetes mellitus without complication ([place])    Patient denies   Hematochezia 06/21/2011   HTN (hypertension) 06/21/2011   Several mild readings in the past, has decline med tx   Hx of colonic polyp 10/08/1996   Colonoscopy-Dr. Sammuel Cooper    Hyperlipidemia 06/21/2011   Impaired glucose tolerance 06/21/2011   Obesity 06/21/2011   Past Surgical History:  Procedure Laterality Date   ABDOMINAL HYSTERECTOMY  1995   bone surgery feet  1993   bone spurs   HEMICOLECTOMY  10/2011    reports that she quit smoking about 36 years ago. Her smoking use included  cigarettes. She has never used smokeless tobacco. She reports that she does not drink alcohol and does not use drugs. family history includes Colon polyps in her mother; Hypertension in her mother. Allergies  Allergen Reactions   Crestor [Rosuvastatin] Other (See Comments)    myalgia   Influenza Vaccines    Lipitor [Atorvastatin] Other (See Comments)    myalgia   Metformin And Related Diarrhea   Current Outpatient Medications on File Prior to Visit  Medication Sig Dispense Refill   Biotin 10 MG CAPS Take by mouth daily.     Ca Carbonate-Mag Hydroxide 550-110 MG CHEW Chew by mouth.     cholecalciferol (VITAMIN D) 400 UNITS TABS Take by mouth.     CHROMIUM PICOLINATE PO Take by mouth every morning.     Cinnamon 500 MG capsule Take by mouth daily.     ezetimibe (ZETIA) 10 MG tablet Take 1 tablet (10 mg total) by mouth daily. 90 tablet 3   faricimab-svoa (VABYSMO) 6 MG/0.05ML SOLN intravitreal injection 6 mg by Intravitreal route once.     Fe Bisgly-Vit C-Vit B12-FA (GENTLE IRON PO) Take by mouth daily.     fish oil-omega-3 fatty acids 1000 MG capsule Take 1 g by mouth daily.     glucose  blood (CONTOUR NEXT TEST) test strip USE AS DIRECTED TO TEST BLOOD SUGAR TWICE DAILY 100 strip 3   Krill Oil 1000 MG CAPS Take by mouth daily.     metFORMIN (GLUCOPHAGE-XR) 500 MG 24 hr tablet Take 1 tablet (500 mg total) by mouth daily with breakfast. 90 tablet 3   Microlet Lancets MISC USE AS DIRECTED TO TEST BLOOD SUGAR TWICE DAILY 100 each 3   Semaglutide,0.25 or 0.5MG/DOS, (OZEMPIC, 0.25 OR 0.5 MG/DOSE,) 2 MG/3ML SOPN 0.5 mg subq once weekly 6 mL 3   vitamin B-12 (CYANOCOBALAMIN) 100 MCG tablet Take 50 mcg by mouth daily.     SPIRULINA PO Take by mouth daily. (Patient not taking: Reported on 02/08/2021)     No current facility-administered medications on file prior to visit.        ROS:  All others reviewed and negative.  Objective        PE:  BP (!) 152/68 (BP Location: Right Arm, Patient  Position: Sitting, Cuff Size: Normal)   Pulse 86   Temp 99 F (37.2 C) (Oral)   Ht '5\' 3"'  (1.6 m)   Wt 156 lb 9.6 oz (71 kg)   LMP 03/01/1987 (Approximate)   SpO2 98%   BMI 27.74 kg/m                 Constitutional: Pt appears in NAD               HENT: Head: NCAT.                Right Ear: External ear normal.                 Left Ear: External ear normal.                Eyes: . Pupils are equal, round, and reactive to light. Conjunctivae and EOM are normal               Nose: without d/c or deformity               Neck: Neck supple. Gross normal ROM               Cardiovascular: Normal rate and regular rhythm.                 Pulmonary/Chest: Effort normal and breath sounds without rales or wheezing.                Abd:  Soft, NT, ND, + BS, no organomegaly               Neurological: Pt is alert. At baseline orientation, motor grossly intact               Skin: Skin is warm. No rashes, no other new lesions, LE edema - none               Psychiatric: Pt behavior is normal without agitation , mod nervous  Micro: none  Cardiac tracings I have personally interpreted today:  none  Pertinent Radiological findings (summarize): none   Lab Results  Component Value Date   WBC 6.9 08/10/2021   HGB 11.0 (L) 08/10/2021   HCT 34.8 (L) 08/10/2021   PLT 197.0 08/10/2021   GLUCOSE 192 (H) 08/10/2021   CHOL 274 (H) 08/10/2021   TRIG 121.0 08/10/2021   HDL 74.90 08/10/2021   LDLDIRECT 150.9 10/23/2012   LDLCALC 175 (H) 08/10/2021   ALT 16 08/10/2021   AST  17 08/10/2021   NA 139 08/10/2021   K 3.9 08/10/2021   CL 103 08/10/2021   CREATININE 0.74 08/10/2021   BUN 8 08/10/2021   CO2 28 08/10/2021   TSH 2.03 08/10/2021   HGBA1C 8.8 (H) 08/10/2021   MICROALBUR 5.6 (H) 08/10/2021   Assessment/Plan:  Sarah Berger is a 71 y.o. Black or African American [2] female with  has a past medical history of Anemia, Anemia, unspecified (10/23/2012), Colon cancer ([place]), Diabetes mellitus without  complication ([place]), Hematochezia (06/21/2011), HTN (hypertension) (06/21/2011), colonic polyp (10/08/1996), Hyperlipidemia (06/21/2011), Impaired glucose tolerance (06/21/2011), and Obesity (06/21/2011).  Vitamin D deficiency Last vitamin D Lab Results  Component Value Date   VD25OH 36.85 08/10/2021   Low, to start oral replacement   Type 2 diabetes mellitus without complication, without long-term current use of insulin (HCC) Lab Results  Component Value Date   HGBA1C 8.8 (H) 08/10/2021   Chronic uncontrolled, goal a1c < 7,  pt to continue current medical treatment metformin ER 500 - 1 qd, but add ozemipic low dose 0.5 mg weekly for better sugar and wt control   Hyperlipidemia Lab Results  Component Value Date   LDLCALC 175 (H) 08/10/2021   Severe uncontrolled, goal ldl < 70, pt to continue current zetia 10 mg qd as has been statin intolerant, declines referral lipid clinic   HTN (hypertension) BP Readings from Last 3 Encounters:  08/20/21 (!) 152/68  08/10/21 134/80  02/08/21 (!) 148/80   Uncontrolled, d/w pt- she would benefit from adding ARB but unwilling due to wary of side effects,  pt to continue medical treatment  - low salt diet, wt control, activity   Microhematuria Asymptomatic, noted on recent lab, for urology referral  Memory change I suspect possible small subacute cva but pt declines add asa, statin, or MRI as she is improved  Dense breasts For MRI soon  Anxiety Pt appears to be more nervous today and seems to interfere with medical tx changes today, declines need for counseling or SSRI today  Followup: Return if symptoms worsen or fail to improve.  Cathlean Cower, MD 08/25/2021 4:30 AM [facility]

## 2021-08-21 ENCOUNTER — Ambulatory Visit
Admission: RE | Admit: 2021-08-21 | Discharge: 2021-08-21 | Disposition: A | Discharge status: Home / Self Care | Payer: BC Managed Care – PPO | Source: Ambulatory Visit | Attending: Internal Medicine | Admitting: Internal Medicine

## 2021-08-21 DIAGNOSIS — R922 Inconclusive mammogram: Secondary | ICD-10-CM

## 2021-08-21 MED ORDER — GADOBUTROL 1 MMOL/ML IV SOLN
7.0000 mL | Freq: Once | INTRAVENOUS | Status: AC | PRN
  Administered 2021-08-21: 7 mL via INTRAVENOUS

## 2021-08-24 ENCOUNTER — Other Ambulatory Visit: Payer: Self-pay | Admitting: Internal Medicine

## 2021-08-24 DIAGNOSIS — R922 Inconclusive mammogram: Secondary | ICD-10-CM

## 2021-08-24 DIAGNOSIS — R9230 Dense breasts, unspecified: Secondary | ICD-10-CM

## 2021-08-25 ENCOUNTER — Encounter: Payer: Self-pay | Admitting: Internal Medicine

## 2021-08-25 ENCOUNTER — Ambulatory Visit
Admission: RE | Admit: 2021-08-25 | Discharge: 2021-08-25 | Disposition: A | Discharge status: Home / Self Care | Payer: BC Managed Care – PPO | Source: Ambulatory Visit | Attending: Internal Medicine | Admitting: Internal Medicine

## 2021-08-25 ENCOUNTER — Other Ambulatory Visit: Payer: Self-pay | Admitting: Internal Medicine

## 2021-08-25 ENCOUNTER — Other Ambulatory Visit: Payer: BC Managed Care – PPO

## 2021-08-25 DIAGNOSIS — R9230 Dense breasts, unspecified: Secondary | ICD-10-CM

## 2021-08-25 DIAGNOSIS — F419 Anxiety disorder, unspecified: Secondary | ICD-10-CM | POA: Insufficient documentation

## 2021-08-25 DIAGNOSIS — N6489 Other specified disorders of breast: Secondary | ICD-10-CM | POA: Diagnosis not present

## 2021-08-25 DIAGNOSIS — R922 Inconclusive mammogram: Secondary | ICD-10-CM

## 2021-08-25 MED ORDER — GADOBUTROL 1 MMOL/ML IV SOLN
6.0000 mL | Freq: Once | INTRAVENOUS | Status: AC | PRN
  Administered 2021-08-25: 6 mL via INTRAVENOUS

## 2021-08-25 NOTE — Assessment & Plan Note (Signed)
BP Readings from Last 3 Encounters:  08/20/21 (!) 152/68  08/10/21 134/80  02/08/21 (!) 148/80   Uncontrolled, d/w pt- she would benefit from adding ARB but unwilling due to wary of side effects,  pt to continue medical treatment  - low salt diet, wt control, activity

## 2021-08-25 NOTE — Assessment & Plan Note (Signed)
Lab Results  Component Value Date   LDLCALC 175 (H) 08/10/2021   Severe uncontrolled, goal ldl < 70, pt to continue current zetia 10 mg qd as has been statin intolerant, declines referral lipid clinic

## 2021-08-25 NOTE — Assessment & Plan Note (Signed)
Asymptomatic, noted on recent lab, for urology referral

## 2021-08-25 NOTE — Assessment & Plan Note (Signed)
I suspect possible small subacute cva but pt declines add asa, statin, or MRI as she is improved

## 2021-08-25 NOTE — Assessment & Plan Note (Signed)
For MRI soon

## 2021-08-25 NOTE — Assessment & Plan Note (Signed)
Lab Results  Component Value Date   HGBA1C 8.8 (H) 08/10/2021   Chronic uncontrolled, goal a1c < 7,  pt to continue current medical treatment metformin ER 500 - 1 qd, but add ozemipic low dose 0.5 mg weekly for better sugar and wt control

## 2021-08-25 NOTE — Assessment & Plan Note (Signed)
Pt appears to be more nervous today and seems to interfere with medical tx changes today, declines need for counseling or SSRI today

## 2021-08-26 ENCOUNTER — Encounter: Payer: Self-pay | Admitting: Internal Medicine

## 2021-09-06 DIAGNOSIS — H31092 Other chorioretinal scars, left eye: Secondary | ICD-10-CM | POA: Diagnosis not present

## 2021-09-06 DIAGNOSIS — E113313 Type 2 diabetes mellitus with moderate nonproliferative diabetic retinopathy with macular edema, bilateral: Secondary | ICD-10-CM | POA: Diagnosis not present

## 2021-09-06 DIAGNOSIS — H43823 Vitreomacular adhesion, bilateral: Secondary | ICD-10-CM | POA: Diagnosis not present

## 2021-09-06 DIAGNOSIS — E113312 Type 2 diabetes mellitus with moderate nonproliferative diabetic retinopathy with macular edema, left eye: Secondary | ICD-10-CM | POA: Diagnosis not present

## 2021-09-06 DIAGNOSIS — H35033 Hypertensive retinopathy, bilateral: Secondary | ICD-10-CM | POA: Diagnosis not present

## 2021-09-20 ENCOUNTER — Telehealth: Payer: Self-pay

## 2021-09-20 NOTE — Telephone Encounter (Signed)
Modesta Messing from [facility] calling stating that they referred patient back to our office for a CVA workup. Patient was showing signs of CVA and they are unable to do eye injections. Gae Gallop that we have no received any messages or referrals but will contact patient to schedule.   Left message for patient to call our office back to schedule an appointment with Dr. Jenny Reichmann.

## 2021-09-29 ENCOUNTER — Encounter: Payer: Self-pay | Admitting: Internal Medicine

## 2021-09-29 ENCOUNTER — Ambulatory Visit: Payer: BC Managed Care – PPO | Admitting: Internal Medicine

## 2021-09-29 VITALS — BP 150/70 | HR 65 | Temp 98.5°F | Ht 63.0 in | Wt 153.8 lb

## 2021-09-29 DIAGNOSIS — E7800 Pure hypercholesterolemia, unspecified: Secondary | ICD-10-CM | POA: Diagnosis not present

## 2021-09-29 DIAGNOSIS — R413 Other amnesia: Secondary | ICD-10-CM | POA: Diagnosis not present

## 2021-09-29 DIAGNOSIS — I10 Essential (primary) hypertension: Secondary | ICD-10-CM | POA: Diagnosis not present

## 2021-09-29 DIAGNOSIS — E119 Type 2 diabetes mellitus without complications: Secondary | ICD-10-CM | POA: Diagnosis not present

## 2021-09-29 DIAGNOSIS — E559 Vitamin D deficiency, unspecified: Secondary | ICD-10-CM

## 2021-09-29 NOTE — Patient Instructions (Signed)
You will be contacted regarding the referral for: MRI for the brain  OK to HOLD on the Vabysmo depending on results from the MRI  Please continue all other medications as before, and refills have been done if requested.  Please have the pharmacy call with any other refills you may need.  Please continue your efforts at being more active, low cholesterol diet, and weight control.  Please keep your appointments with your specialists as you may have planned

## 2021-09-29 NOTE — Progress Notes (Signed)
Patient ID: Sarah Berger, female   DOB: October 15, 1950, 71 y.o.   MRN: [id number]        Chief Complaint: follow up recent TIA/cva symptoms post Vabysmo       HPI:  Sarah Berger is a 71 y.o. female here with c/o persistent memory issue which seems to persistent in that she cannot recall names or PIN numbers she used to know , had to change bank accounts.  Because this is persistent, she believes now that her eye injection medication that was changed from one kind that was not effective to Vabysmo with one injection only just prior to onset of her memory issue the next day, may have been related.  Since then has not taken further Vabysmo and wondering if I agreed with her idea to not take this further, and any other evaluation needed.  Currenlty not taking asa or statin and does not want to start for now.          Wt Readings from Last 3 Encounters:  09/29/21 153 lb 12.8 oz (69.8 kg)  08/20/21 156 lb 9.6 oz (71 kg)  08/10/21 154 lb 12.8 oz (70.2 kg)   BP Readings from Last 3 Encounters:  09/29/21 (!) 150/70  08/20/21 (!) 152/68  08/10/21 134/80         Past Medical History:  Diagnosis Date   Anemia    Hx of    Anemia, unspecified 10/23/2012   Colon cancer ([place])    Diabetes mellitus without complication ([place])    Patient denies   Hematochezia 06/21/2011   HTN (hypertension) 06/21/2011   Several mild readings in the past, has decline med tx   Hx of colonic polyp 10/08/1996   Colonoscopy-Dr. Sammuel Cooper    Hyperlipidemia 06/21/2011   Impaired glucose tolerance 06/21/2011   Obesity 06/21/2011   Past Surgical History:  Procedure Laterality Date   ABDOMINAL HYSTERECTOMY  1995   bone surgery feet  1993   bone spurs   HEMICOLECTOMY  10/2011    reports that she quit smoking about 37 years ago. Her smoking use included cigarettes. She has never used smokeless tobacco. She reports that she does not drink alcohol and does not use drugs. family history includes Colon polyps in her mother; Hypertension  in her mother. Allergies  Allergen Reactions   Crestor [Rosuvastatin] Other (See Comments)    myalgia   Influenza Vaccines    Lipitor [Atorvastatin] Other (See Comments)    myalgia   Metformin And Related Diarrhea   Current Outpatient Medications on File Prior to Visit  Medication Sig Dispense Refill   Biotin 10 MG CAPS Take by mouth daily.     Ca Carbonate-Mag Hydroxide 550-110 MG CHEW Chew by mouth.     cholecalciferol (VITAMIN D) 400 UNITS TABS Take by mouth.     CHROMIUM PICOLINATE PO Take by mouth every morning.     Cinnamon 500 MG capsule Take by mouth daily.     ezetimibe (ZETIA) 10 MG tablet Take 1 tablet (10 mg total) by mouth daily. 90 tablet 3   faricimab-svoa (VABYSMO) 6 MG/0.05ML SOLN intravitreal injection 6 mg by Intravitreal route once.     Fe Bisgly-Vit C-Vit B12-FA (GENTLE IRON PO) Take by mouth daily.     fish oil-omega-3 fatty acids 1000 MG capsule Take 1 g by mouth daily.     glucose blood (CONTOUR NEXT TEST) test strip USE AS DIRECTED TO TEST BLOOD SUGAR TWICE DAILY 100 strip 3   Krill Oil  1000 MG CAPS Take by mouth daily.     metFORMIN (GLUCOPHAGE-XR) 500 MG 24 hr tablet Take 1 tablet (500 mg total) by mouth daily with breakfast. 90 tablet 3   Microlet Lancets MISC USE AS DIRECTED TO TEST BLOOD SUGAR TWICE DAILY 100 each 3   Semaglutide,0.25 or 0.'5MG'$ /DOS, (OZEMPIC, 0.25 OR 0.5 MG/DOSE,) 2 MG/3ML SOPN 0.5 mg subq once weekly 6 mL 3   vitamin B-12 (CYANOCOBALAMIN) 100 MCG tablet Take 50 mcg by mouth daily.     No current facility-administered medications on file prior to visit.        ROS:  All others reviewed and negative.  Objective        PE:  BP (!) 150/70 (BP Location: Right Arm, Patient Position: Sitting, Cuff Size: Normal)   Pulse 65   Temp 98.5 F (36.9 C) (Oral)   Ht '5\' 3"'$  (1.6 m)   Wt 153 lb 12.8 oz (69.8 kg)   LMP 03/01/1987 (Approximate)   SpO2 99%   BMI 27.24 kg/m                 Constitutional: Pt appears in NAD               HENT:  Head: NCAT.                Right Ear: External ear normal.                 Left Ear: External ear normal.                Eyes: . Pupils are equal, round, and reactive to light. Conjunctivae and EOM are normal               Nose: without d/c or deformity               Neck: Neck supple. Gross normal ROM               Cardiovascular: Normal rate and regular rhythm.                 Pulmonary/Chest: Effort normal and breath sounds without rales or wheezing.                Abd:  Soft, NT, ND, + BS, no organomegaly               Neurological: Pt is alert. At baseline orientation, motor grossly intact, cn 2-12 intact               Skin: Skin is warm. No rashes, no other new lesions, LE edema - none               Psychiatric: Pt behavior is normal without agitation , nervous 2+  Micro: none  Cardiac tracings I have personally interpreted today:  none  Pertinent Radiological findings (summarize): none   Lab Results  Component Value Date   WBC 6.9 08/10/2021   HGB 11.0 (L) 08/10/2021   HCT 34.8 (L) 08/10/2021   PLT 197.0 08/10/2021   GLUCOSE 192 (H) 08/10/2021   CHOL 274 (H) 08/10/2021   TRIG 121.0 08/10/2021   HDL 74.90 08/10/2021   LDLDIRECT 150.9 10/23/2012   LDLCALC 175 (H) 08/10/2021   ALT 16 08/10/2021   AST 17 08/10/2021   NA 139 08/10/2021   K 3.9 08/10/2021   CL 103 08/10/2021   CREATININE 0.74 08/10/2021   BUN 8 08/10/2021   CO2 28 08/10/2021   TSH  2.03 08/10/2021   HGBA1C 8.8 (H) 08/10/2021   MICROALBUR 5.6 (H) 08/10/2021   Assessment/Plan:  Sarah Berger is a 71 y.o. Black or African American [2] female with  has a past medical history of Anemia, Anemia, unspecified (10/23/2012), Colon cancer ([place]), Diabetes mellitus without complication ([place]), Hematochezia (06/21/2011), HTN (hypertension) (06/21/2011), colonic polyp (10/08/1996), Hyperlipidemia (06/21/2011), Impaired glucose tolerance (06/21/2011), and Obesity (06/21/2011).  Memory loss D/w pt - cant r/o vabysmo related to  her subjective memory concerns; and will need MRI brain, then consider add asa, statin and neurology referral if found to have CVA  HTN (hypertension) BP Readings from Last 3 Encounters:  09/29/21 (!) 150/70  08/20/21 (!) 152/68  08/10/21 134/80   Uncontrolled, pt to continue medical low salt diet, wt control, exercise, declines antiBP med tx for now, will continue to monitor at home as well  Type 2 diabetes mellitus without complication, without long-term current use of insulin (HCC) Lab Results  Component Value Date   HGBA1C 8.8 (H) 08/10/2021   Recent uncontrolled, pt to continue current medical treatment metformiin ER 500 qd, ozempic 0.5 weekly and DM diet   Hyperlipidemia Has been statin intolerant in past, to continue zetia for now, low chol DM diet,  Lab Results  Component Value Date   LDLCALC 175 (H) 08/10/2021   Consider lipid clinic referral for repatha  Vitamin D deficiency Last vitamin D Lab Results  Component Value Date   VD25OH 36.85 08/10/2021   Low, to start oral replacement  Followup: Return in about 3 months (around 12/30/2021), or if symptoms worsen or fail to improve.  Cathlean Cower, MD 10/02/2021 8:16 PM [facility]

## 2021-10-02 ENCOUNTER — Encounter: Payer: Self-pay | Admitting: Internal Medicine

## 2021-10-02 NOTE — Assessment & Plan Note (Signed)
BP Readings from Last 3 Encounters:  09/29/21 (!) 150/70  08/20/21 (!) 152/68  08/10/21 134/80   Uncontrolled, pt to continue medical low salt diet, wt control, exercise, declines antiBP med tx for now, will continue to monitor at home as well

## 2021-10-02 NOTE — Assessment & Plan Note (Signed)
Lab Results  Component Value Date   HGBA1C 8.8 (H) 08/10/2021   Recent uncontrolled, pt to continue current medical treatment metformiin ER 500 qd, ozempic 0.5 weekly and DM diet

## 2021-10-02 NOTE — Assessment & Plan Note (Addendum)
Has been statin intolerant in past, to continue zetia for now, low chol DM diet,  Lab Results  Component Value Date   LDLCALC 175 (H) 08/10/2021   Consider lipid clinic referral for repatha

## 2021-10-02 NOTE — Assessment & Plan Note (Signed)
Last vitamin D Lab Results  Component Value Date   VD25OH 36.85 08/10/2021   Low, to start oral replacement

## 2021-10-02 NOTE — Assessment & Plan Note (Signed)
D/w pt - cant r/o vabysmo related to her subjective memory concerns; and will need MRI brain, then consider add asa, statin and neurology referral if found to have CVA

## 2021-10-14 ENCOUNTER — Ambulatory Visit
Admission: RE | Admit: 2021-10-14 | Discharge: 2021-10-14 | Disposition: A | Discharge status: Home / Self Care | Payer: BC Managed Care – PPO | Source: Ambulatory Visit | Attending: Internal Medicine | Admitting: Internal Medicine

## 2021-10-14 DIAGNOSIS — R413 Other amnesia: Secondary | ICD-10-CM | POA: Diagnosis not present

## 2021-10-14 DIAGNOSIS — I6782 Cerebral ischemia: Secondary | ICD-10-CM | POA: Diagnosis not present

## 2021-10-14 DIAGNOSIS — I6381 Other cerebral infarction due to occlusion or stenosis of small artery: Secondary | ICD-10-CM | POA: Diagnosis not present

## 2021-10-14 DIAGNOSIS — R29818 Other symptoms and signs involving the nervous system: Secondary | ICD-10-CM | POA: Diagnosis not present

## 2021-10-16 ENCOUNTER — Encounter: Payer: Self-pay | Admitting: Internal Medicine

## 2021-10-16 ENCOUNTER — Other Ambulatory Visit: Payer: Self-pay | Admitting: Internal Medicine

## 2021-10-16 MED ORDER — ASPIRIN 81 MG PO TBEC: 81.0000 mg | DELAYED_RELEASE_TABLET | Freq: Every day | ORAL | 12 refills | Status: DC

## 2021-10-19 ENCOUNTER — Telehealth: Payer: Self-pay | Admitting: Internal Medicine

## 2021-10-19 NOTE — Telephone Encounter (Signed)
Pt missed call to get imaging results. She is requesting a callback with the results. Pt would also like to know if she will receive a copy of her results.

## 2021-10-19 NOTE — Telephone Encounter (Signed)
Spoke with patient regarding results, she expressed some concerns. Those concerns were relayed to [place] in the result note.

## 2021-10-22 NOTE — Telephone Encounter (Signed)
Note not needed 

## 2021-11-17 ENCOUNTER — Encounter: Payer: Self-pay | Admitting: Internal Medicine

## 2021-11-17 ENCOUNTER — Telehealth: Payer: Self-pay | Admitting: Internal Medicine

## 2021-11-17 NOTE — Telephone Encounter (Signed)
Called pt to let them that letter is ready for picked up

## 2021-11-17 NOTE — Telephone Encounter (Signed)
[employer] needs a letter stating why patient had to have an MRI due to breast denseness in tissue and a copy of the MRI.  Please call patient when this is ready.  Please call her at :  [phone].  She needs this letter or [employer] is going to charge her over $1000 for the MRI.

## 2021-11-17 NOTE — Telephone Encounter (Signed)
Letter done hardcopy to [facility]

## 2021-11-18 NOTE — Telephone Encounter (Signed)
Derrick called in from [employer], stating a letter will not cut it to cover the bill. They need medical records sent over to them in order to review the bill.

## 2021-12-02 NOTE — Telephone Encounter (Signed)
Called pt and pt stated that someone from the insurance office told her that someone within our office is working on a pre approval for the charge the insurance made.

## 2022-01-03 NOTE — Telephone Encounter (Signed)
Pt medical release form was signed by pt and faxed to medical records to, faxed back her records to send it to her insurance. Called insurance to get their faxed but was not able to, called pt to let her know pt stated she will call them tomorrow and call back Wednesday to let us know

## 2022-01-05 NOTE — Telephone Encounter (Signed)
Spoke to pt, pt stated that she talked to [place] from [employer] who said that a pre authorization was never done so that is the reason they will not cover her cost and also that she could have gotten other alternative test done like an ultrasound instead of the MRI. Dorothey Baseman number, left voice mail to call back

## 2022-01-05 NOTE — Telephone Encounter (Signed)
Pt called and wanted to know if the issues ha been resolve, made pt aware that they were supposed to call their insurance to see if it has been, since she is saying that one of the insurance representative said they spoke to someone from the office to resolve it.    Asked pt who they spoke to, pt said she was never given a name and they told her that they will not cover it since it was never pre authorize before she got the procedure done.  Let pt know she should call her insurance back and asked and if there is any question to call the office

## 2022-01-06 NOTE — Telephone Encounter (Signed)
Called pt insurance agent Lattie Haw, left voice mail for pt to call back

## 2022-01-06 NOTE — Telephone Encounter (Signed)
Called pt insurance representative Lattie Haw, and left voice mail for them to call me back. Called pt also and left voice mail to call back in regards to not getting in touch with Lattie Haw

## 2022-01-24 NOTE — Telephone Encounter (Signed)
Called pt and let her know that I was not able to reach out to her insurance company and the number she provided me with.

## 2022-02-09 ENCOUNTER — Encounter: Payer: BC Managed Care – PPO | Admitting: Internal Medicine

## 2022-04-04 ENCOUNTER — Encounter: Payer: Self-pay | Admitting: Internal Medicine

## 2022-04-04 ENCOUNTER — Other Ambulatory Visit: Payer: Self-pay | Admitting: Internal Medicine

## 2022-04-04 ENCOUNTER — Ambulatory Visit (INDEPENDENT_AMBULATORY_CARE_PROVIDER_SITE_OTHER): Payer: Medicare Other | Admitting: Internal Medicine

## 2022-04-04 VITALS — BP 136/76 | HR 76 | Temp 98.2°F | Ht 63.0 in | Wt 153.0 lb

## 2022-04-04 DIAGNOSIS — E119 Type 2 diabetes mellitus without complications: Secondary | ICD-10-CM

## 2022-04-04 DIAGNOSIS — I10 Essential (primary) hypertension: Secondary | ICD-10-CM | POA: Diagnosis not present

## 2022-04-04 DIAGNOSIS — H538 Other visual disturbances: Secondary | ICD-10-CM

## 2022-04-04 DIAGNOSIS — E538 Deficiency of other specified B group vitamins: Secondary | ICD-10-CM | POA: Diagnosis not present

## 2022-04-04 DIAGNOSIS — E7800 Pure hypercholesterolemia, unspecified: Secondary | ICD-10-CM | POA: Diagnosis not present

## 2022-04-04 DIAGNOSIS — Z0001 Encounter for general adult medical examination with abnormal findings: Secondary | ICD-10-CM | POA: Diagnosis not present

## 2022-04-04 DIAGNOSIS — E559 Vitamin D deficiency, unspecified: Secondary | ICD-10-CM | POA: Diagnosis not present

## 2022-04-04 LAB — CBC WITH DIFFERENTIAL/PLATELET
Basophils Absolute: 0 10*3/uL (ref 0.0–0.1)
Basophils Relative: 0.4 % (ref 0.0–3.0)
Eosinophils Absolute: 0.1 10*3/uL (ref 0.0–0.7)
Eosinophils Relative: 1.2 % (ref 0.0–5.0)
HCT: 32.9 % — ABNORMAL LOW (ref 36.0–46.0)
Hemoglobin: 10.6 g/dL — ABNORMAL LOW (ref 12.0–15.0)
Lymphocytes Relative: 17.5 % (ref 12.0–46.0)
Lymphs Abs: 1.3 10*3/uL (ref 0.7–4.0)
MCHC: 32.2 g/dL (ref 30.0–36.0)
MCV: 72.6 fl — ABNORMAL LOW (ref 78.0–100.0)
Monocytes Absolute: 0.4 10*3/uL (ref 0.1–1.0)
Monocytes Relative: 5.4 % (ref 3.0–12.0)
Neutro Abs: 5.5 10*3/uL (ref 1.4–7.7)
Neutrophils Relative %: 75.5 % (ref 43.0–77.0)
Platelets: 221 10*3/uL (ref 150.0–400.0)
RBC: 4.54 Mil/uL (ref 3.87–5.11)
RDW: 16 % — ABNORMAL HIGH (ref 11.5–15.5)
WBC: 7.3 10*3/uL (ref 4.0–10.5)

## 2022-04-04 LAB — URINALYSIS, ROUTINE W REFLEX MICROSCOPIC
Bilirubin Urine: NEGATIVE
Hgb urine dipstick: NEGATIVE
Ketones, ur: NEGATIVE
Leukocytes,Ua: NEGATIVE
Nitrite: NEGATIVE
RBC / HPF: NONE SEEN (ref 0–?)
Specific Gravity, Urine: 1.02 (ref 1.000–1.030)
Total Protein, Urine: NEGATIVE
Urine Glucose: NEGATIVE
Urobilinogen, UA: 0.2 (ref 0.0–1.0)
pH: 6.5 (ref 5.0–8.0)

## 2022-04-04 LAB — BASIC METABOLIC PANEL
BUN: 10 mg/dL (ref 6–23)
CO2: 26 mEq/L (ref 19–32)
Calcium: 9.3 mg/dL (ref 8.4–10.5)
Chloride: 103 mEq/L (ref 96–112)
Creatinine, Ser: 0.77 mg/dL (ref 0.40–1.20)
GFR: 77.48 mL/min (ref >60.00)
Glucose, Bld: 217 mg/dL — ABNORMAL HIGH (ref 70–99)
Potassium: 4.2 mEq/L (ref 3.5–5.1)
Sodium: 139 mEq/L (ref 135–145)

## 2022-04-04 LAB — LIPID PANEL
Cholesterol: 282 mg/dL — ABNORMAL HIGH (ref 0–200)
HDL: 76.6 mg/dL (ref >39.00)
LDL Cholesterol: 189 mg/dL — ABNORMAL HIGH (ref 0–99)
NonHDL: 205.62
Total CHOL/HDL Ratio: 4
Triglycerides: 81 mg/dL (ref 0.0–149.0)
VLDL: 16.2 mg/dL (ref 0.0–40.0)

## 2022-04-04 LAB — TSH: TSH: 2.48 u[IU]/mL (ref 0.35–5.50)

## 2022-04-04 LAB — VITAMIN B12: Vitamin B-12: 483 pg/mL (ref 211–911)

## 2022-04-04 LAB — HEMOGLOBIN A1C: Hgb A1c MFr Bld: 9.1 % — ABNORMAL HIGH (ref 4.6–6.5)

## 2022-04-04 LAB — MICROALBUMIN / CREATININE URINE RATIO
Creatinine,U: 70 mg/dL
Microalb Creat Ratio: 3 mg/g (ref 0.0–30.0)
Microalb, Ur: 2.1 mg/dL — ABNORMAL HIGH (ref 0.0–1.9)

## 2022-04-04 LAB — HEPATIC FUNCTION PANEL
ALT: 15 U/L (ref 0–35)
AST: 21 U/L (ref 0–37)
Albumin: 4.3 g/dL (ref 3.5–5.2)
Alkaline Phosphatase: 82 U/L (ref 39–117)
Bilirubin, Direct: 0.1 mg/dL (ref 0.0–0.3)
Total Bilirubin: 0.8 mg/dL (ref 0.2–1.2)
Total Protein: 7.5 g/dL (ref 6.0–8.3)

## 2022-04-04 LAB — VITAMIN D 25 HYDROXY (VIT D DEFICIENCY, FRACTURES): VITD: 22.21 ng/mL — ABNORMAL LOW (ref 30.00–100.00)

## 2022-04-04 MED ORDER — SEMAGLUTIDE (1 MG/DOSE) 4 MG/3ML ~~LOC~~ SOPN: 1.0000 mg | PEN_INJECTOR | SUBCUTANEOUS | 3 refills | Status: DC

## 2022-04-04 NOTE — Patient Instructions (Addendum)
Please continue all other medications as before, and refills have been done if requested.  Please have the pharmacy call with any other refills you may need.  Please continue your efforts at being more active, low cholesterol diet, and weight control.  You are otherwise up to date with prevention measures today.  Please keep your appointments with your specialists as you may have planned  You will be contacted regarding the referral for: Dr Katy Fitch - eye doctor  Please go to the LAB at the blood drawing area for the tests to be done  You will be contacted by phone if any changes need to be made immediately.  Otherwise, you will receive a letter about your results with an explanation, but please check with MyChart first.  Please remember to sign up for MyChart if you have not done so, as this will be important to you in the future with finding out test results, communicating by private email, and scheduling acute appointments online when needed.  Please make an Appointment to return in 6 months, or sooner if needed

## 2022-04-04 NOTE — Progress Notes (Unsigned)
Patient ID: Sarah Berger, female   DOB: 06/18/1950, 72 y.o.   MRN: [id number]         Chief Complaint:: wellness exam and dm, hld, htn, low vit d       HPI:  Sarah Berger is a 72 y.o. 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  Pt denies chest pain, increased sob or doe, wheezing, orthopnea, PND, increased LE swelling, palpitations, dizziness or syncope.   Pt denies polydipsia, polyuria, or new focal neuro s/s.    Pt denies fever, wt loss, night sweats, loss of appetite, or other constitutional symptoms     Wt Readings from Last 3 Encounters:  04/04/22 153 lb (69.4 kg)  09/29/21 153 lb 12.8 oz (69.8 kg)  08/20/21 156 lb 9.6 oz (71 kg)   BP Readings from Last 3 Encounters:  04/04/22 136/76  09/29/21 (!) 150/70  08/20/21 (!) 152/68   Immunization History  Administered Date(s) Administered   Fluad Quad(high Dose 65+) 02/08/2021   Pneumococcal Conjugate-13 08/04/2017   Pneumococcal Polysaccharide-23 03/18/2014   Tdap 10/25/2012   There are no preventive care reminders to display for this patient.     Past Medical History:  Diagnosis Date   Anemia    Hx of    Anemia, unspecified 10/23/2012   Colon cancer ([place])    Diabetes mellitus without complication ([place])    Patient denies   Hematochezia 06/21/2011   HTN (hypertension) 06/21/2011   Several mild readings in the past, has decline med tx   Hx of colonic polyp 10/08/1996   Colonoscopy-Dr. Sammuel Cooper    Hyperlipidemia 06/21/2011   Impaired glucose tolerance 06/21/2011   Obesity 06/21/2011   Past Surgical History:  Procedure Laterality Date   ABDOMINAL HYSTERECTOMY  1995   bone surgery feet  1993   bone spurs   HEMICOLECTOMY  10/2011    reports that  she quit smoking about 37 years ago. Her smoking use included cigarettes. She has never used smokeless tobacco. She reports that she does not drink alcohol and does not use drugs. family history includes Colon polyps in her mother; Hypertension in her mother. Allergies  Allergen Reactions   Crestor [Rosuvastatin] Other (See Comments)    myalgia   Influenza Vaccines    Lipitor [Atorvastatin] Other (See Comments)    myalgia   Metformin And Related Diarrhea   Current Outpatient Medications on File Prior to Visit  Medication Sig Dispense Refill   aspirin EC 81 MG tablet Take 1 tablet (81 mg total) by mouth daily. Swallow whole. 30 tablet 12   Biotin 10 MG CAPS Take by mouth daily.     Ca Carbonate-Mag Hydroxide 550-110 MG CHEW Chew by mouth.     cholecalciferol (VITAMIN D) 400 UNITS TABS Take by mouth.     CHROMIUM PICOLINATE PO Take by mouth every morning.     Cinnamon 500 MG capsule Take by mouth daily.     ezetimibe (ZETIA) 10 MG tablet Take 1 tablet (10 mg total) by mouth daily. 90 tablet 3   faricimab-svoa (VABYSMO) 6 MG/0.05ML SOLN intravitreal injection 6 mg  by Intravitreal route once.     Fe Bisgly-Vit C-Vit B12-FA (GENTLE IRON PO) Take by mouth daily.     fish oil-omega-3 fatty acids 1000 MG capsule Take 1 g by mouth daily.     glucose blood (CONTOUR NEXT TEST) test strip USE AS DIRECTED TO TEST BLOOD SUGAR TWICE DAILY 100 strip 3   Krill Oil 1000 MG CAPS Take by mouth daily.     metFORMIN (GLUCOPHAGE-XR) 500 MG 24 hr tablet Take 1 tablet (500 mg total) by mouth daily with breakfast. 90 tablet 3   Microlet Lancets MISC USE AS DIRECTED TO TEST BLOOD SUGAR TWICE DAILY 100 each 3   vitamin B-12 (CYANOCOBALAMIN) 100 MCG tablet Take 50 mcg by mouth daily.     No current facility-administered medications on file prior to visit.        ROS:  All others reviewed and negative.  Objective        PE:  BP 136/76 (BP Location: Left Arm, Patient Position: Sitting, Cuff Size: Large)   Pulse  76   Temp 98.2 F (36.8 C) (Oral)   Ht '5\' 3"'$  (1.6 m)   Wt 153 lb (69.4 kg)   LMP 03/01/1987 (Approximate)   SpO2 95%   BMI 27.10 kg/m                 Constitutional: Pt appears in NAD               HENT: Head: NCAT.                Right Ear: External ear normal.                 Left Ear: External ear normal.                Eyes: . Pupils are equal, round, and reactive to light. Conjunctivae and EOM are normal               Nose: without d/c or deformity               Neck: Neck supple. Gross normal ROM               Cardiovascular: Normal rate and regular rhythm.                 Pulmonary/Chest: Effort normal and breath sounds without rales or wheezing.                Abd:  Soft, NT, ND, + BS, no organomegaly               Neurological: Pt is alert. At baseline orientation, motor grossly intact               Skin: Skin is warm. No rashes, no other new lesions, LE edema - none               Psychiatric: Pt behavior is normal without agitation   Micro: none  Cardiac tracings I have personally interpreted today:  none  Pertinent Radiological findings (summarize): none   Lab Results  Component Value Date   WBC 7.3 04/04/2022   HGB 10.6 (L) 04/04/2022   HCT 32.9 (L) 04/04/2022   PLT 221.0 04/04/2022   GLUCOSE 217 (H) 04/04/2022   CHOL 282 (H) 04/04/2022   TRIG 81.0 04/04/2022   HDL 76.60 04/04/2022   LDLDIRECT 150.9 10/23/2012   LDLCALC 189 (H) 04/04/2022   ALT 15 04/04/2022   AST 21  04/04/2022   NA 139 04/04/2022   K 4.2 04/04/2022   CL 103 04/04/2022   CREATININE 0.77 04/04/2022   BUN 10 04/04/2022   CO2 26 04/04/2022   TSH 2.48 04/04/2022   HGBA1C 9.1 (H) 04/04/2022   MICROALBUR 2.1 (H) 04/04/2022   Assessment/Plan:  Sarah Berger is a 72 y.o. Black or African American [2] female with  has a past medical history of Anemia, Anemia, unspecified (10/23/2012), Colon cancer ([place]), Diabetes mellitus without complication ([place]), Hematochezia (06/21/2011), HTN (hypertension)  (06/21/2011), colonic polyp (10/08/1996), Hyperlipidemia (06/21/2011), Impaired glucose tolerance (06/21/2011), and Obesity (06/21/2011).  Encounter for well adult exam with abnormal findings Age and sex appropriate education and counseling updated with regular exercise and diet Referrals for preventative services - for eye exam referral Immunizations addressed - declines covid booster, will have shingrix at pharmacy Smoking counseling  - none needed Evidence for depression or other mood disorder - none significant Most recent labs reviewed. I have personally reviewed and have noted: 1) the patient's medical and social history 2) The patient's current medications and supplements 3) The patient's height, weight, and BMI have been recorded in the chart   Type 2 diabetes mellitus without complication, without long-term current use of insulin (HCC) Lab Results  Component Value Date   HGBA1C 9.1 (H) 04/04/2022   Uncontrolled,, pt to continue current medical treatment metformin ER 500 mg 1 qd, but increased ozempic to 1 mg weekly   Hyperlipidemia Lab Results  Component Value Date   LDLCALC 189 (H) 04/04/2022   Severe uncontrolled,has been statin intolerant, for zetia 10 mg qd, low chol diet   HTN (hypertension) BP Readings from Last 3 Encounters:  04/04/22 136/76  09/29/21 (!) 150/70  08/20/21 (!) 152/68   Stable, pt to continue medical treatment  - diet, wt control, declines med tx   Vitamin D deficiency Last vitamin D Lab Results  Component Value Date   VD25OH 22.21 (L) 04/04/2022   Low, to start oral replacement  Followup: Return in about 6 months (around 10/03/2022).  Cathlean Cower, MD 04/06/2022 8:41 PM [facility]

## 2022-04-06 ENCOUNTER — Encounter: Payer: Self-pay | Admitting: Internal Medicine

## 2022-04-06 NOTE — Assessment & Plan Note (Signed)
BP Readings from Last 3 Encounters:  04/04/22 136/76  09/29/21 (!) 150/70  08/20/21 (!) 152/68   Stable, pt to continue medical treatment  - diet, wt control, declines med tx

## 2022-04-06 NOTE — Assessment & Plan Note (Signed)
Lab Results  Component Value Date   HGBA1C 9.1 (H) 04/04/2022   Uncontrolled,, pt to continue current medical treatment metformin ER 500 mg 1 qd, but increased ozempic to 1 mg weekly

## 2022-04-06 NOTE — Assessment & Plan Note (Signed)
Age and sex appropriate education and counseling updated with regular exercise and diet Referrals for preventative services - for eye exam referral Immunizations addressed - declines covid booster, will have shingrix at pharmacy Smoking counseling  - none needed Evidence for depression or other mood disorder - none significant Most recent labs reviewed. I have personally reviewed and have noted: 1) the patient's medical and social history 2) The patient's current medications and supplements 3) The patient's height, weight, and BMI have been recorded in the chart

## 2022-04-06 NOTE — Assessment & Plan Note (Signed)
Last vitamin D Lab Results  Component Value Date   VD25OH 22.21 (L) 04/04/2022   Low, to start oral replacement

## 2022-04-06 NOTE — Assessment & Plan Note (Signed)
Lab Results  Component Value Date   LDLCALC 189 (H) 04/04/2022   Severe uncontrolled,has been statin intolerant, for zetia 10 mg qd, low chol diet

## 2022-04-07 ENCOUNTER — Telehealth: Payer: Self-pay

## 2022-04-07 MED ORDER — OZEMPIC (0.25 OR 0.5 MG/DOSE) 2 MG/3ML ~~LOC~~ SOPN: PEN_INJECTOR | SUBCUTANEOUS | 11 refills | Status: DC

## 2022-04-07 NOTE — Telephone Encounter (Signed)
Well, just to be on the safe side, we should start at the 0.25 mg dose then, to avoid problems with nausea and stomach upset - I will send

## 2022-04-07 NOTE — Telephone Encounter (Signed)
See below

## 2022-04-07 NOTE — Telephone Encounter (Signed)
Notified pt w/MD response.../lmb 

## 2022-04-07 NOTE — Telephone Encounter (Signed)
Patient returned call regarding results and states that she has never started the [place] .'5mg'$  and would like to know if she can start there instead of taking the '1mg'$ . Please advise

## 2022-04-11 ENCOUNTER — Telehealth: Payer: Self-pay

## 2022-04-11 NOTE — Telephone Encounter (Signed)
Pt states she does not want to inject herself with th new medication she was rx'd. Pt is wanting to come into the office have the clinic do her Semaglutide,0.25 or 0.5MG/DOS, (OZEMPIC, 0.25 OR 0.5 MG/DOSE,) 2 MG/3ML SOPN injections. It was explained to her that we do not do those injections here. However, I did inform her I will send Dr. Jenny Reichmann a message about the matter and she will be contacted with his instructions and advice.

## 2022-04-12 MED ORDER — METFORMIN HCL ER 500 MG PO TB24: 1000.0000 mg | ORAL_TABLET | Freq: Every day | ORAL | 3 refills | Status: DC

## 2022-04-12 NOTE — Addendum Note (Signed)
Addended by: Biagio Borg on: 04/12/2022 04:27 PM   Modules accepted: Orders

## 2022-04-12 NOTE — Telephone Encounter (Signed)
Ok to not take the [facility] for increased metformin ER 500 mg to 2 tabs per day - done erx

## 2022-04-12 NOTE — Telephone Encounter (Signed)
Called pt no answer LMOM w/MD response../lmb 

## 2022-04-29 ENCOUNTER — Ambulatory Visit (INDEPENDENT_AMBULATORY_CARE_PROVIDER_SITE_OTHER): Payer: Medicare Other | Admitting: Internal Medicine

## 2022-04-29 VITALS — BP 150/78 | HR 72 | Temp 98.1°F | Ht 63.0 in | Wt 146.0 lb

## 2022-04-29 DIAGNOSIS — E7800 Pure hypercholesterolemia, unspecified: Secondary | ICD-10-CM | POA: Diagnosis not present

## 2022-04-29 DIAGNOSIS — R413 Other amnesia: Secondary | ICD-10-CM | POA: Diagnosis not present

## 2022-04-29 DIAGNOSIS — E119 Type 2 diabetes mellitus without complications: Secondary | ICD-10-CM

## 2022-04-29 DIAGNOSIS — I10 Essential (primary) hypertension: Secondary | ICD-10-CM | POA: Diagnosis not present

## 2022-04-29 DIAGNOSIS — I6381 Other cerebral infarction due to occlusion or stenosis of small artery: Secondary | ICD-10-CM | POA: Diagnosis not present

## 2022-04-29 MED ORDER — ROSUVASTATIN CALCIUM 10 MG PO TABS: 10.0000 mg | ORAL_TABLET | Freq: Every day | ORAL | 3 refills | Status: AC

## 2022-04-29 MED ORDER — METFORMIN HCL ER 500 MG PO TB24: 2000.0000 mg | ORAL_TABLET | Freq: Every day | ORAL | 3 refills | Status: AC

## 2022-04-29 MED ORDER — ASPIRIN 81 MG PO TBEC: 81.0000 mg | DELAYED_RELEASE_TABLET | Freq: Every day | ORAL | 12 refills | Status: AC

## 2022-04-29 NOTE — Patient Instructions (Addendum)
Please take all new medication as prescribed  - the Aspirin 81 mg per day (such as Ecotrin 81 mg that is OTC)  Please take all new medication as prescribed - the generic crestor 10 mg to prevent stroke  Please continue all other medications as before, including the restart of the metformin ER 500 mg at 4 pills per day  Please have the pharmacy call with any other refills you may need.  Please continue your efforts at being more active, low cholesterol diet, and weight control.  Please keep your appointments with your specialists as you may have planned  You will be contacted regarding the referral for: Neurology  Please make an Appointment to return in 3 months, or sooner if needed

## 2022-04-29 NOTE — Progress Notes (Unsigned)
Patient ID: Sarah Berger, female   DOB: 1950-09-23, 72 y.o.   MRN: [id number]        Chief Complaint: follow up HTN, HLD and hyperglycemia , memory loss       HPI:  Sarah Berger is a 72 y.o. female here with family (daughter and mother) neither of which appear to have memory difficulty, but endorse worsening memory loss of pt for over 3 months, of which the pt denies and seems to be unaware.  Recent MRI brain with small vessel changes and old lacunar infarct.  Pt no longer taking her meds well, has been out of metformin for several months.  Pt denies chest pain, increased sob or doe, wheezing, orthopnea, PND, increased LE swelling, palpitations, dizziness or syncope.   Pt denies polydipsia, polyuria, or new focal neuro s/s.    Pt denies fever, wt loss, night sweats, loss of appetite, or other constitutional symptoms         Wt Readings from Last 3 Encounters:  04/29/22 146 lb (66.2 kg)  04/04/22 153 lb (69.4 kg)  09/29/21 153 lb 12.8 oz (69.8 kg)   BP Readings from Last 3 Encounters:  04/29/22 (!) 150/78  04/04/22 136/76  09/29/21 (!) 150/70         Past Medical History:  Diagnosis Date   Anemia    Hx of    Anemia, unspecified 10/23/2012   Colon cancer ([place])    Diabetes mellitus without complication ([place])    Patient denies   Hematochezia 06/21/2011   HTN (hypertension) 06/21/2011   Several mild readings in the past, has decline med tx   Hx of colonic polyp 10/08/1996   Colonoscopy-Dr. Sammuel Cooper    Hyperlipidemia 06/21/2011   Impaired glucose tolerance 06/21/2011   Obesity 06/21/2011   Past Surgical History:  Procedure Laterality Date   ABDOMINAL HYSTERECTOMY  1995   bone surgery feet  1993   bone spurs   HEMICOLECTOMY  10/2011    reports that she quit smoking about 37 years ago. Her smoking use included cigarettes. She has never used smokeless tobacco. She reports that she does not drink alcohol and does not use drugs. family history includes Colon polyps in her mother;  Hypertension in her mother. Allergies  Allergen Reactions   Influenza Vaccines    Lipitor [Atorvastatin] Other (See Comments)    myalgia   Metformin And Related Diarrhea   Current Outpatient Medications on File Prior to Visit  Medication Sig Dispense Refill   Biotin 10 MG CAPS Take by mouth daily.     Ca Carbonate-Mag Hydroxide 550-110 MG CHEW Chew by mouth.     cholecalciferol (VITAMIN D) 400 UNITS TABS Take by mouth.     CHROMIUM PICOLINATE PO Take by mouth every morning.     Cinnamon 500 MG capsule Take by mouth daily.     ezetimibe (ZETIA) 10 MG tablet Take 1 tablet (10 mg total) by mouth daily. 90 tablet 3   faricimab-svoa (VABYSMO) 6 MG/0.05ML SOLN intravitreal injection 6 mg by Intravitreal route once.     Fe Bisgly-Vit C-Vit B12-FA (GENTLE IRON PO) Take by mouth daily.     fish oil-omega-3 fatty acids 1000 MG capsule Take 1 g by mouth daily.     glucose blood (CONTOUR NEXT TEST) test strip USE AS DIRECTED TO TEST BLOOD SUGAR TWICE DAILY 100 strip 3   Krill Oil 1000 MG CAPS Take by mouth daily.     Microlet Lancets MISC USE AS DIRECTED TO  TEST BLOOD SUGAR TWICE DAILY 100 each 3   vitamin B-12 (CYANOCOBALAMIN) 100 MCG tablet Take 50 mcg by mouth daily.     No current facility-administered medications on file prior to visit.        ROS:  All others reviewed and negative.  Objective        PE:  BP (!) 150/78 (BP Location: Right Arm, Patient Position: Sitting, Cuff Size: Large)   Pulse 72   Temp 98.1 F (36.7 C) (Oral)   Ht '5\' 3"'$  (1.6 m)   Wt 146 lb (66.2 kg)   LMP 03/01/1987 (Approximate)   SpO2 91%   BMI 25.86 kg/m                 Constitutional: Pt appears in NAD               HENT: Head: NCAT.                Right Ear: External ear normal.                 Left Ear: External ear normal.                Eyes: . Pupils are equal, round, and reactive to light. Conjunctivae and EOM are normal               Nose: without d/c or deformity               Neck: Neck supple.  Gross normal ROM               Cardiovascular: Normal rate and regular rhythm.                 Pulmonary/Chest: Effort normal and breath sounds without rales or wheezing.                Abd:  Soft, NT, ND, + BS, no organomegaly               Neurological: Pt is alert. At baseline orientation, motor grossly intact               Skin: Skin is warm. No rashes, no other new lesions, LE edema - none               Psychiatric: Pt behavior is normal without agitation   Micro: none  Cardiac tracings I have personally interpreted today:  none  Pertinent Radiological findings (summarize): none   Lab Results  Component Value Date   WBC 7.3 04/04/2022   HGB 10.6 (L) 04/04/2022   HCT 32.9 (L) 04/04/2022   PLT 221.0 04/04/2022   GLUCOSE 217 (H) 04/04/2022   CHOL 282 (H) 04/04/2022   TRIG 81.0 04/04/2022   HDL 76.60 04/04/2022   LDLDIRECT 150.9 10/23/2012   LDLCALC 189 (H) 04/04/2022   ALT 15 04/04/2022   AST 21 04/04/2022   NA 139 04/04/2022   K 4.2 04/04/2022   CL 103 04/04/2022   CREATININE 0.77 04/04/2022   BUN 10 04/04/2022   CO2 26 04/04/2022   TSH 2.48 04/04/2022   HGBA1C 9.1 (H) 04/04/2022   MICROALBUR 2.1 (H) 04/04/2022   Assessment/Plan:  Sarah Berger is a 72 y.o. Black or African American [2] female with  has a past medical history of Anemia, Anemia, unspecified (10/23/2012), Colon cancer ([place]), Diabetes mellitus without complication ([place]), Hematochezia (06/21/2011), HTN (hypertension) (06/21/2011), colonic polyp (10/08/1996), Hyperlipidemia (06/21/2011), Impaired glucose tolerance (06/21/2011), and Obesity (06/21/2011).  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, for referral neurology, daughter to assist with meds  HTN (hypertension) BP Readings from Last 3 Encounters:  04/29/22 (!) 150/78  04/04/22 136/76  09/29/21 (!) 150/70   Uncontrolled, pt declines med change for now, states BP ok at home   Hyperlipidemia Lab Results  Component Value Date    LDLCALC 189 (H) 04/04/2022   Severe, in light of hx of lacunar cva goal LDL < 70, for crestor 10 mg start   Type 2 diabetes mellitus without complication, without long-term current use of insulin (HCC) Lab Results  Component Value Date   HGBA1C 9.1 (H) 04/04/2022   Uncontrolled with med non compliance,  pt to restart metformin ER at higher dosing 500 mg - 4 qam   Lacunar cerebrovascular accident (CVA) ([place]) To start asa 81 mg qd, and crestor 10 mg, refer neurology  Followup: Return in about 3 months (around 07/30/2022).  Cathlean Cower, MD 05/01/2022 5:25 PM [facility]

## 2022-05-01 ENCOUNTER — Encounter: Payer: Self-pay | Admitting: Internal Medicine

## 2022-05-01 DIAGNOSIS — I6381 Other cerebral infarction due to occlusion or stenosis of small artery: Secondary | ICD-10-CM | POA: Insufficient documentation

## 2022-05-01 NOTE — Assessment & Plan Note (Signed)
BP Readings from Last 3 Encounters:  04/29/22 (!) 150/78  04/04/22 136/76  09/29/21 (!) 150/70   Uncontrolled, pt declines med change for now, states BP ok at home

## 2022-05-01 NOTE — Assessment & Plan Note (Addendum)
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

## 2022-05-01 NOTE — Assessment & Plan Note (Signed)
To start asa 81 mg qd, and crestor 10 mg, refer neurology

## 2022-05-01 NOTE — Assessment & Plan Note (Addendum)
Lab Results  Component Value Date   HGBA1C 9.1 (H) 04/04/2022   Uncontrolled with med non compliance,  pt to restart metformin ER at higher dosing 500 mg - 4 qam

## 2022-05-01 NOTE — Assessment & Plan Note (Signed)
Lab Results  Component Value Date   LDLCALC 189 (H) 04/04/2022   Severe, in light of hx of lacunar cva goal LDL < 70, for crestor 10 mg start

## 2022-07-06 ENCOUNTER — Encounter: Payer: Self-pay | Admitting: Neurology

## 2022-07-06 ENCOUNTER — Ambulatory Visit: Payer: Medicare Other | Admitting: Neurology

## 2022-07-06 VITALS — BP 156/77 | HR 65 | Ht 63.0 in | Wt 147.5 lb

## 2022-07-06 DIAGNOSIS — R030 Elevated blood-pressure reading, without diagnosis of hypertension: Secondary | ICD-10-CM | POA: Diagnosis not present

## 2022-07-06 DIAGNOSIS — G3184 Mild cognitive impairment, so stated: Secondary | ICD-10-CM | POA: Diagnosis not present

## 2022-07-06 NOTE — Patient Instructions (Addendum)
ATN profile to look for Alzheimer disease biomarker's.  Continue current medications  Follow up in a year or sooner if worse  There are well-accepted and sensible ways to reduce risk for Alzheimers disease and other degenerative brain disorders .  Exercise Daily Walk A daily 20 minute walk should be part of your routine. Disease related apathy can be a significant roadblock to exercise and the only way to overcome this is to make it a daily routine and perhaps have a reward at the end (something your loved one loves to eat or drink perhaps) or a personal trainer coming to the home can also be very useful. Most importantly, the patient is much more likely to exercise if the caregiver / spouse does it with him/her. In general a structured, repetitive schedule is best.  General Health: Any diseases which effect your body will effect your brain such as a pneumonia, urinary infection, blood clot, heart attack or stroke. Keep contact with your primary care doctor for regular follow ups.  Sleep. A good nights sleep is healthy for the brain. Seven hours is recommended. If you have insomnia or poor sleep habits we can give you some instructions. If you have sleep apnea wear your mask.  Diet: Eating a heart healthy diet is also a good idea; fish and poultry instead of red meat, nuts (mostly non-peanuts), vegetables, fruits, olive oil or canola oil (instead of butter), minimal salt (use other spices to flavor foods), whole grain rice, bread, cereal and pasta and wine in moderation.Research is now showing that the MIND diet, which is a combination of The Mediterranean diet and the DASH diet, is beneficial for cognitive processing and longevity. Information about this diet can be found in The MIND Diet, a book by Alonna Minium, MS, RDN, and online at [employer]Wild[employer].es  Finances, Power of [address] and Advance Directives: You should consider putting legal safeguards in place with regard to  financial and medical decision making. While the spouse always has power of attorney for medical and financial issues in the absence of any form, you should consider what you want in case the spouse / caregiver is no longer around or capable of making decisions.

## 2022-07-06 NOTE — Progress Notes (Signed)
[facility]  PATIENT: Sarah Berger DOB: 1950-03-21  REQUESTING CLINICIAN: Corwin Levins, MD HISTORY FROM: Patient and daughter  REASON FOR VISIT: Memory problems    HISTORICAL  CHIEF COMPLAINT:  Chief Complaint  Patient presents with   New Patient (Initial Visit)    Rm 13, daughter present  [place]:17 Memory     HISTORY OF PRESENT ILLNESS:  This is a 72 year old woman past medical history hypertension, hyperlipidemia, diabetes mellitus who was referred to neurology for memory problem.  Patient feels like her memory is fine, she is not overly forgetful.  She does live alone, independent all activities of daily living.  Daughter also reports that patient is somehow forgetful, she tends to be repetitive but is able to hold a conversation without any issues.  There was a event last year where she woke up confused, did not know where she was, forgot her bank card pin number, but other than that she is independent in all activities of daily living.  She still drives, denies any recent accident, denies being lost in familiar places.   TBI:   No past history of TBI Stroke:   no past history of stroke Seizures:   no past history of seizures Sleep:   no history of sleep apnea.  Mood:   patient denies anxiety and depression Family history of Dementia:   Denies  Functional status: independent in all ADLs and IADLs Patient lives alone. Cooking: yes, no issues Cleaning: no issues Shopping: no issues  Bathing: no issues Toileting: no issues Driving: yes, denies any recent accident or being loss in familiar places  Bills: yes, denies being late  Ever left the stove on by accident?: Denies Forget how to use items around the house?: Denies  Getting lost going to familiar places?: Denies  Forgetting loved ones names?: Denies  Word finding difficulty? Denies  Sleep: Good    OTHER MEDICAL CONDITIONS: Diabetes Mellitus, Hyperlipidemia, Hypertension     REVIEW OF  SYSTEMS: Full 14 system review of systems performed and negative with exception of: As noted in the HPI   ALLERGIES: Allergies  Allergen Reactions   Influenza Vaccines    Lipitor [Atorvastatin] Other (See Comments)    myalgia   Metformin And Related Diarrhea    HOME MEDICATIONS: Outpatient Medications Prior to Visit  Medication Sig Dispense Refill   aspirin EC 81 MG tablet Take 1 tablet (81 mg total) by mouth daily. Swallow whole. 30 tablet 12   Biotin 10 MG CAPS Take by mouth daily.     Ca Carbonate-Mag Hydroxide 550-110 MG CHEW Chew by mouth.     cholecalciferol (VITAMIN D) 400 UNITS TABS Take by mouth.     CHROMIUM PICOLINATE PO Take by mouth every morning.     Cinnamon 500 MG capsule Take by mouth daily.     ezetimibe (ZETIA) 10 MG tablet Take 1 tablet (10 mg total) by mouth daily. 90 tablet 3   faricimab-svoa (VABYSMO) 6 MG/0.05ML SOLN intravitreal injection 6 mg by Intravitreal route once.     Fe Bisgly-Vit C-Vit B12-FA (GENTLE IRON PO) Take by mouth daily.     fish oil-omega-3 fatty acids 1000 MG capsule Take 1 g by mouth daily.     glucose blood (CONTOUR NEXT TEST) test strip USE AS DIRECTED TO TEST BLOOD SUGAR TWICE DAILY 100 strip 3   Krill Oil 1000 MG CAPS Take by mouth daily.     metFORMIN (GLUCOPHAGE-XR) 500 MG 24 hr tablet Take 4 tablets (  2,000 mg total) by mouth daily with breakfast. 360 tablet 3   Microlet Lancets MISC USE AS DIRECTED TO TEST BLOOD SUGAR TWICE DAILY 100 each 3   rosuvastatin (CRESTOR) 10 MG tablet Take 1 tablet (10 mg total) by mouth daily. 90 tablet 3   vitamin B-12 (CYANOCOBALAMIN) 100 MCG tablet Take 50 mcg by mouth daily.     No facility-administered medications prior to visit.    PAST MEDICAL HISTORY: Past Medical History:  Diagnosis Date   Anemia    Hx of    Anemia, unspecified 10/23/2012   Colon cancer ([facility])    Diabetes mellitus without complication ([facility])    Patient denies   Hematochezia 06/21/2011   HTN (hypertension) 06/21/2011    Several mild readings in the past, has decline med tx   Hx of colonic polyp 10/08/1996   Colonoscopy-Dr. Sherin Quarry    Hyperlipidemia 06/21/2011   Impaired glucose tolerance 06/21/2011   Memory loss    Obesity 06/21/2011    PAST SURGICAL HISTORY: Past Surgical History:  Procedure Laterality Date   ABDOMINAL HYSTERECTOMY  1995   bone surgery feet  1993   bone spurs   HEMICOLECTOMY  10/2011    FAMILY HISTORY: Family History  Problem Relation Age of Onset   Hypertension Mother    Colon polyps Mother    Colon cancer Neg Hx    Esophageal cancer Neg Hx    Rectal cancer Neg Hx    Stomach cancer Neg Hx     SOCIAL HISTORY: Social History   Socioeconomic History   Marital status: Divorced    Spouse name: Not on file   Number of children: 1   Years of education: 14   Highest education level: Not on file  Occupational History   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  Vaping Use   Vaping Use: Never used  Substance and Sexual Activity   Alcohol use: No   Drug use: No   Sexual activity: Not Currently    Birth control/protection: Surgical  Other Topics Concern   Not on file  Social History Narrative   Not on file   Social Determinants of Health   Financial Resource Strain: Not on file  Food Insecurity: Not on file  Transportation Needs: Not on file  Physical Activity: Not on file  Stress: Not on file  Social Connections: Not on file  Intimate Partner Violence: Not on file    PHYSICAL EXAM  GENERAL EXAM/CONSTITUTIONAL: Vitals:  Vitals:   07/06/22 0854  BP: (!) 156/77  Pulse: 65  Weight: 147 lb 8 oz (66.9 kg)  Height: 5\' 3"  (1.6 m)   Body mass index is 26.13 kg/m. Wt Readings from Last 3 Encounters:  07/06/22 147 lb 8 oz (66.9 kg)  04/29/22 146 lb (66.2 kg)  04/04/22 153  lb (69.4 kg)   Patient is in no distress; well developed, nourished and groomed; neck is supple  MUSCULOSKELETAL: Gait, strength, tone, movements noted in Neurologic exam below  NEUROLOGIC: MENTAL STATUS:      No data to display            07/06/2022    8:58 AM  Montreal Cognitive Assessment   Visuospatial/ Executive (0/5) 3  Naming (0/3) 3  Attention: Read list of digits (  0/2) 1  Attention: Read list of letters (0/1) 1  Attention: Serial 7 subtraction starting at 100 (0/3) 1  Language: Repeat phrase (0/2) 1  Language : Fluency (0/1) 0  Abstraction (0/2) 2  Delayed Recall (0/5) 0  Orientation (0/6) 5  Total 17    CRANIAL NERVE:  2nd, 3rd, 4th, 6th- visual fields full to confrontation, extraocular muscles intact, no nystagmus 5th - facial sensation symmetric 7th - facial strength symmetric 8th - hearing intact 9th - palate elevates symmetrically, uvula midline 11th - shoulder shrug symmetric 12th - tongue protrusion midline  MOTOR:  normal bulk and tone, full strength in the BUE, BLE  SENSORY:  normal and symmetric to light touch  COORDINATION:  finger-nose-finger, fine finger movements normal  GAIT/STATION:  normal   DIAGNOSTIC DATA (LABS, IMAGING, TESTING) - I reviewed patient records, labs, notes, testing and imaging myself where available.  Lab Results  Component Value Date   WBC 7.3 04/04/2022   HGB 10.6 (L) 04/04/2022   HCT 32.9 (L) 04/04/2022   MCV 72.6 (L) 04/04/2022   PLT 221.0 04/04/2022      Component Value Date/Time   NA 139 04/04/2022 0928   K 4.2 04/04/2022 0928   CL 103 04/04/2022 0928   CO2 26 04/04/2022 0928   GLUCOSE 217 (H) 04/04/2022 0928   BUN 10 04/04/2022 0928   CREATININE 0.77 04/04/2022 0928   CALCIUM 9.3 04/04/2022 0928   PROT 7.5 04/04/2022 0928   ALBUMIN 4.3 04/04/2022 0928   AST 21 04/04/2022 0928   ALT 15 04/04/2022 0928   ALKPHOS 82 04/04/2022 0928   BILITOT 0.8 04/04/2022 0928   Lab Results  Component  Value Date   CHOL 282 (H) 04/04/2022   HDL 76.60 04/04/2022   LDLCALC 189 (H) 04/04/2022   LDLDIRECT 150.9 10/23/2012   TRIG 81.0 04/04/2022   CHOLHDL 4 04/04/2022   Lab Results  Component Value Date   HGBA1C 9.1 (H) 04/04/2022   Lab Results  Component Value Date   VITAMINB12 483 04/04/2022   Lab Results  Component Value Date   TSH 2.48 04/04/2022    MRI Brain 10/15/2021 1. No evidence of acute intracranial abnormality. 2. Mild chronic small-vessel ischemic changes within the cerebral white matter. 3. Chronic lunar infarcts within the right basal ganglia and left thalamus. 4. Mild generalized parenchymal atrophy.   ASSESSMENT AND PLAN  72 y.o. year old female with history of hypertension, hyperlipidemia, diabetes mellitus who is presenting with memory problem, patient feels her memory is intact even though her daughter reports that she is repetitive.  She lives alone, independent in all activities of daily living.  Today her MoCA score was 17 showing impairment.  Her recent dementia labs including B12 and TSH were within normal limits.  Patient likely has mild cognitive impairment and plan is to obtain a ATN profile to look for Alzheimer disease biomarkers.  I will contact her to go over the results.  If Alzheimer dementia biomarker present, we will discuss initiating therapy.  If biomarker's absent then, another consideration is mild cognitive impairment due to vascular disease as her MRIs show chronic small vessel disease and chronic infarct. Continue to follow up with PCP and return in a year. Due to her small vessel disease on MRI, will also strongly recommend start anti-hypertensive medications.    1. Mild cognitive impairment   2. Elevated blood pressure reading      Patient Instructions  ATN profile to look for Alzheimer disease biomarker's.  Continue current medications  Follow up in a year or sooner if worse  There are well-accepted and sensible ways to reduce risk  for Alzheimers disease and other degenerative brain disorders .  Exercise Daily Walk A daily 20 minute walk should be part of your routine. Disease related apathy can be a significant roadblock to exercise and the only way to overcome this is to make it a daily routine and perhaps have a reward at the end (something your loved one loves to eat or drink perhaps) or a personal trainer coming to the home can also be very useful. Most importantly, the patient is much more likely to exercise if the caregiver / spouse does it with him/her. In general a structured, repetitive schedule is best.  General Health: Any diseases which effect your body will effect your brain such as a pneumonia, urinary infection, blood clot, heart attack or stroke. Keep contact with your primary care doctor for regular follow ups.  Sleep. A good nights sleep is healthy for the brain. Seven hours is recommended. If you have insomnia or poor sleep habits we can give you some instructions. If you have sleep apnea wear your mask.  Diet: Eating a heart healthy diet is also a good idea; fish and poultry instead of red meat, nuts (mostly non-peanuts), vegetables, fruits, olive oil or canola oil (instead of butter), minimal salt (use other spices to flavor foods), whole grain rice, bread, cereal and pasta and wine in moderation.Research is now showing that the MIND diet, which is a combination of The Mediterranean diet and the DASH diet, is beneficial for cognitive processing and longevity. Information about this diet can be found in The MIND Diet, a book by Alonna Minium, MS, RDN, and online at [employer].es  Finances, Power of [address] and Advance Directives: You should consider putting legal safeguards in place with regard to financial and medical decision making. While the spouse always has power of attorney for medical and financial issues in the absence of any form, you should consider what you want in case  the spouse / caregiver is no longer around or capable of making decisions.     Orders Placed This Encounter  Procedures   ATN PROFILE    No orders of the defined types were placed in this encounter.   Return in about 1 year (around 07/06/2023).  I have spent a total of 60 minutes dedicated to this patient today, preparing to see patient, performing a medically appropriate examination and evaluation, ordering tests and/or medications and procedures, and counseling and educating the patient/family/caregiver; independently interpreting result and communicating results to the family/patient/caregiver; and documenting clinical information in the electronic medical record.   Windell Norfolk, MD 07/06/2022, 11:05 AM  [facility] [address], [address] [place], [place] [address] [phone]

## 2023-01-12 ENCOUNTER — Encounter: Payer: Self-pay | Admitting: Internal Medicine

## 2023-05-04 ENCOUNTER — Telehealth: Payer: Self-pay

## 2023-05-04 NOTE — Telephone Encounter (Signed)
 Patient was identified as falling into the [place] Measure - Diabetes.   Patient was: Left voicemail to schedule with primary care provider.

## 2023-07-06 ENCOUNTER — Ambulatory Visit: Payer: Medicare Other | Admitting: Neurology

## 2023-08-28 ENCOUNTER — Telehealth: Payer: Self-pay | Admitting: Neurology

## 2023-08-28 NOTE — Telephone Encounter (Signed)
 Request made for 1 yr f/u

## 2023-11-18 IMAGING — MG MM DIGITAL SCREENING BILAT W/ TOMO AND CAD
8 series · 9 of 24 positions shown · non-contrast
Comparison: Previous exam(s).

CLINICAL DATA: Screening.

EXAM:
DIGITAL SCREENING BILATERAL MAMMOGRAM WITH TOMOSYNTHESIS AND CAD
TECHNIQUE: Bilateral screening digital craniocaudal and mediolateral oblique
mammograms were obtained. Bilateral screening digital breast
tomosynthesis was performed. The images were evaluated with
computer-aided detection.

[L MLO synth-2D]
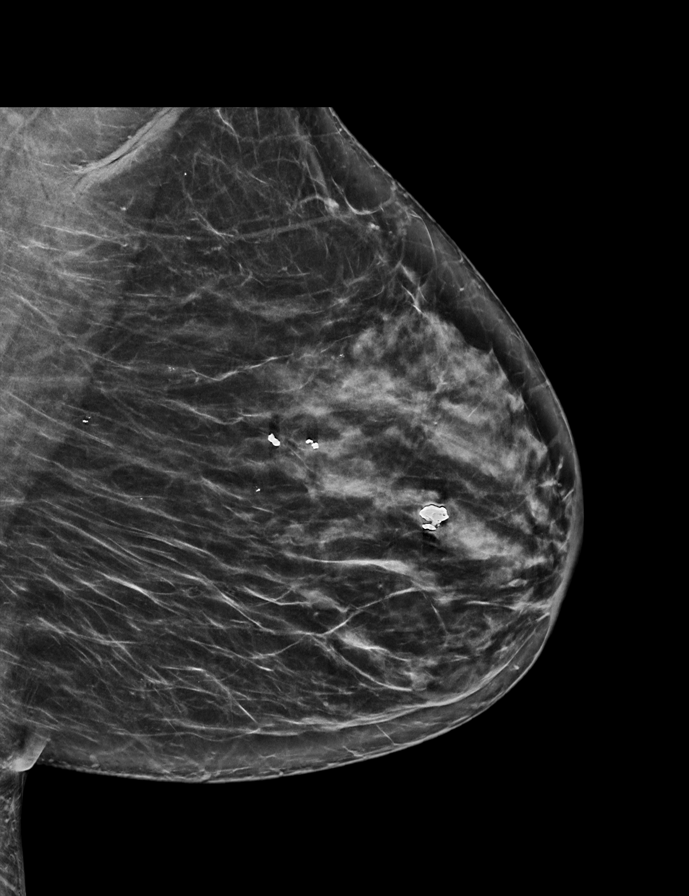

[L CC synth-2D]
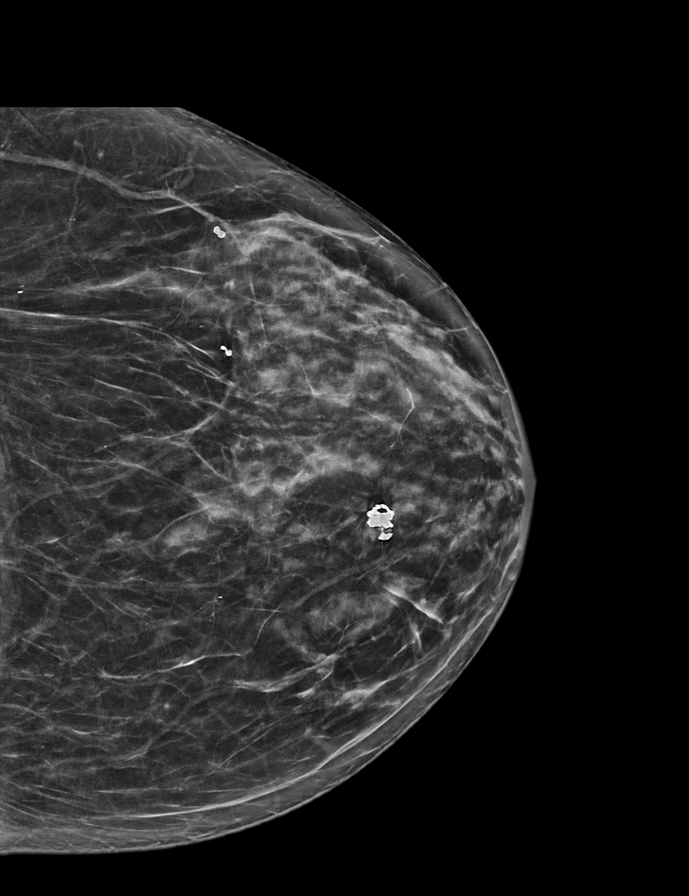

[R CC synth-2D]
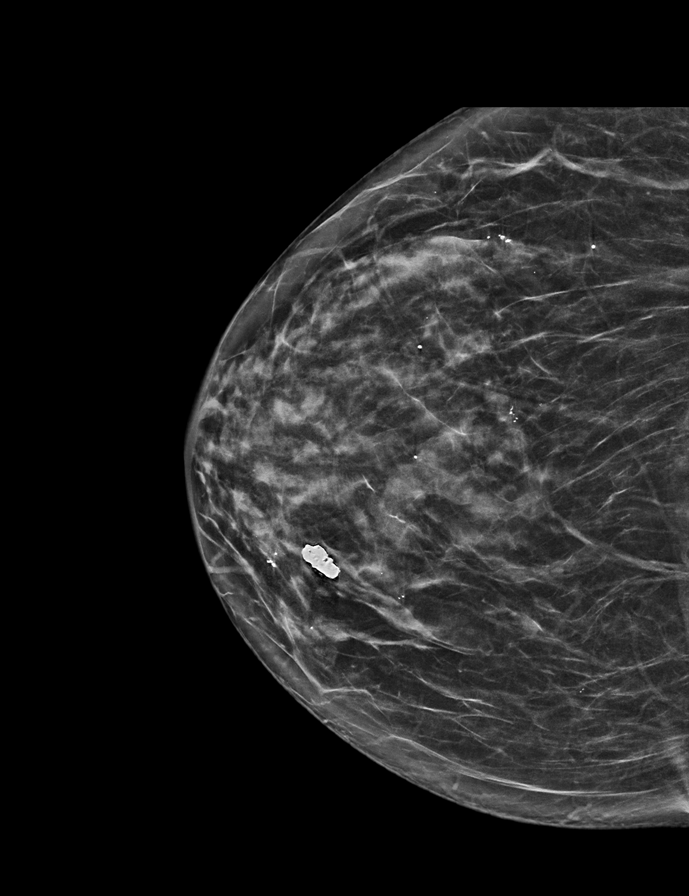

[R MLO synth-2D]
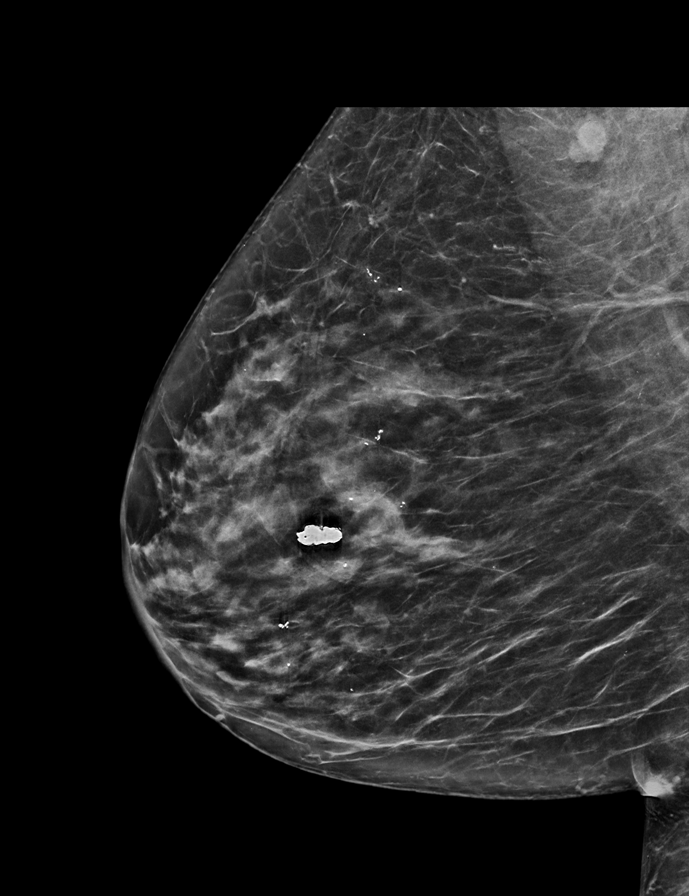

[R CC tomo · 2 of 62 frames shown]
[frame 21/62]
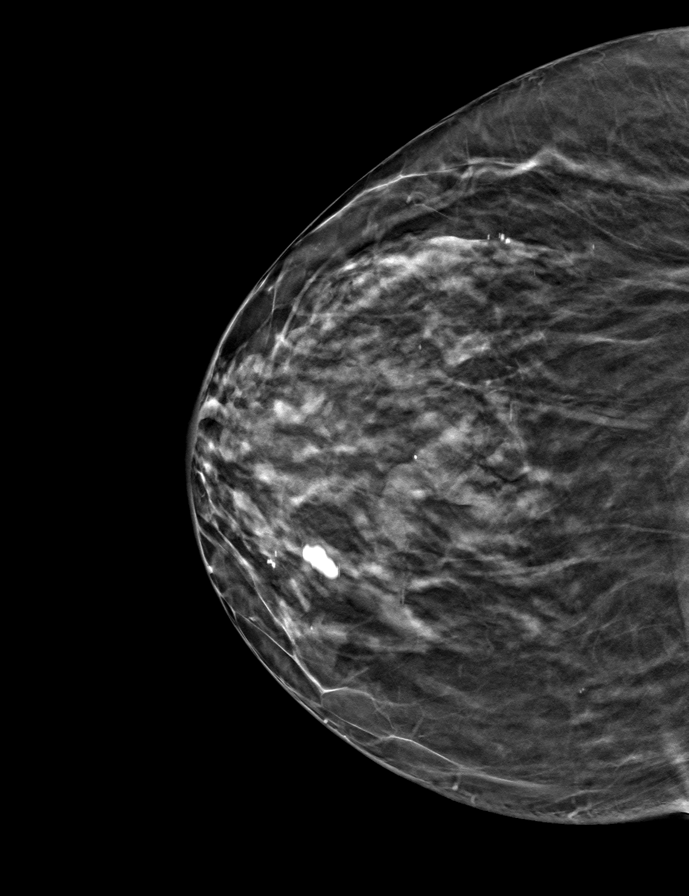
[frame 31/62]
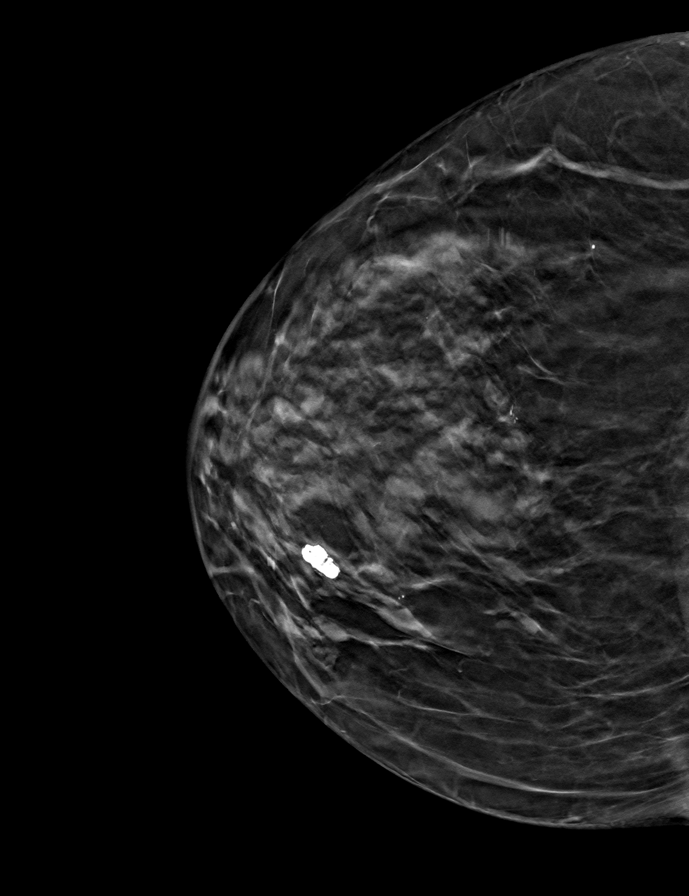

[L MLO tomo · tomo slice 33/65.0]
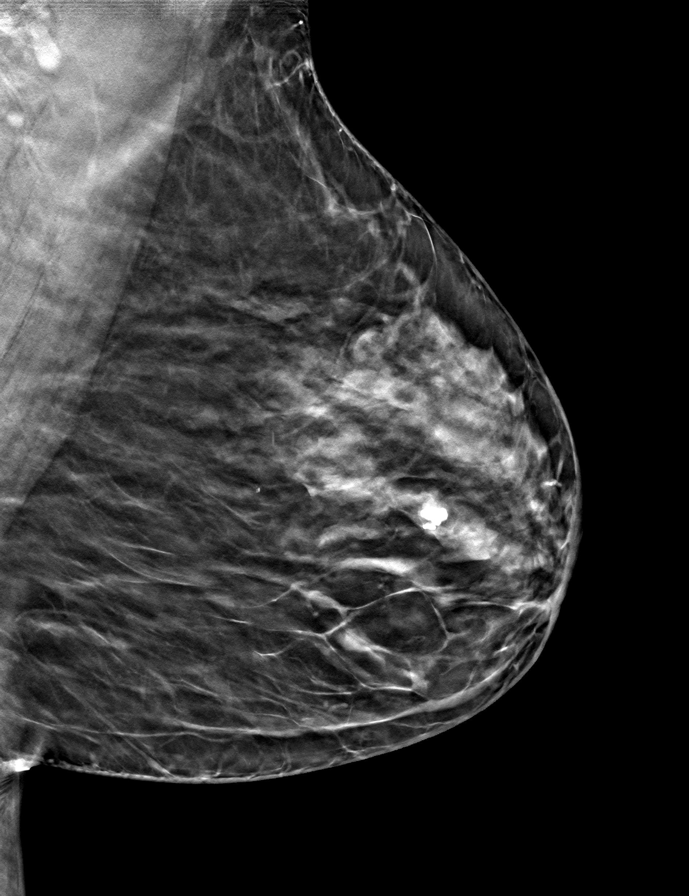

[L CC tomo · tomo slice 31/60.0]
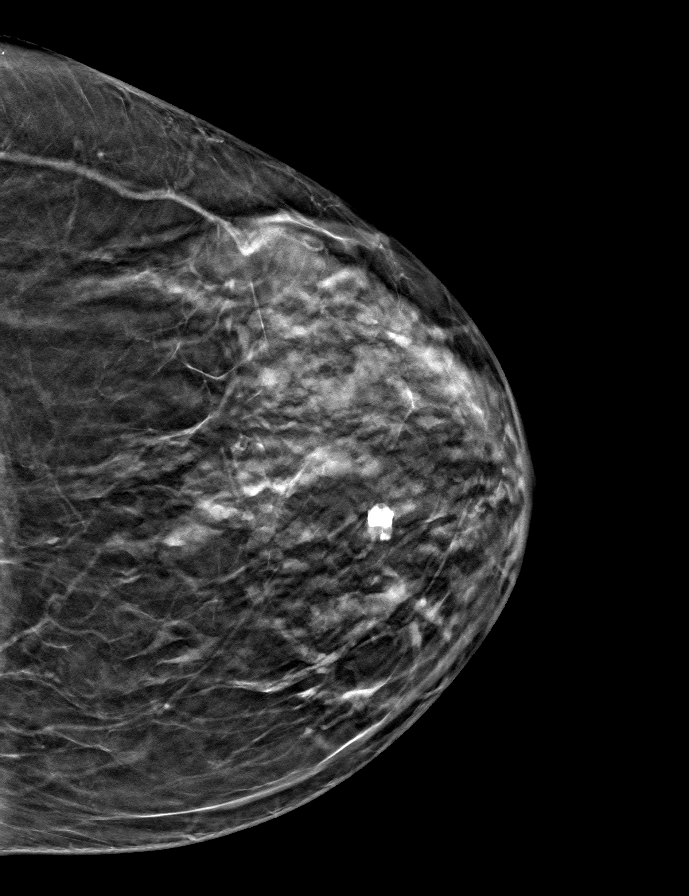

[R MLO tomo · tomo slice 33/66.0]
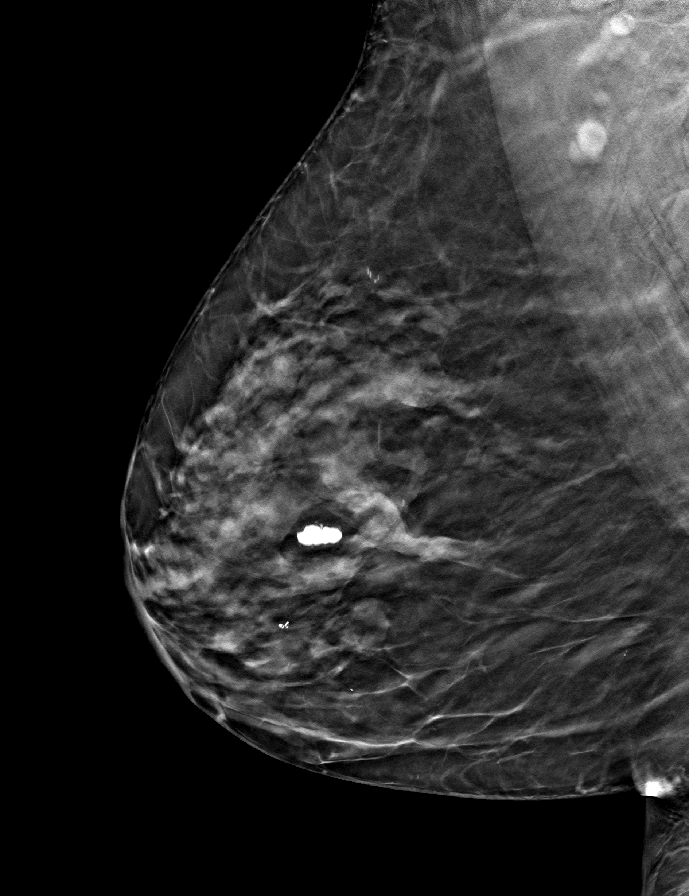

[9 of 24 positions shown; findings below may reference images not displayed]

ACR Breast Density Category c: The breast tissue is heterogeneously
dense, which may obscure small masses.
FINDINGS: There are no findings suspicious for malignancy.
IMPRESSION: No mammographic evidence of malignancy. A result letter of this
screening mammogram will be mailed directly to the patient.

RECOMMENDATION:
Screening mammogram in one year. (Code:Q3-W-BC3)

BI-RADS CATEGORY  1: Negative.

## 2024-01-09 ENCOUNTER — Encounter: Payer: Self-pay | Admitting: *Deleted

## 2024-01-09 NOTE — Progress Notes (Signed)
 Sarah Berger                                          MRN: [id number]   01/09/2024   The [facility] Quality Team Specialist reviewed this patient medical record for the purposes of chart review for care gap closure. The following were reviewed: chart review for care gap closure-diabetic eye exam, glycemic status assessment, and kidney health evaluation for diabetes:eGFR  and uACR.    [facility] Quality Team

## 2024-01-10 ENCOUNTER — Telehealth: Payer: Self-pay

## 2024-01-10 NOTE — Telephone Encounter (Signed)
 Patient is overdue for an appointment. LMOM for patient to call and schedule.

## 2024-01-16 ENCOUNTER — Telehealth: Payer: Self-pay

## 2024-01-16 NOTE — Telephone Encounter (Signed)
 Patient is overdue for an appointment. LMOM for patient to call and schedule.

## 2024-02-01 ENCOUNTER — Other Ambulatory Visit: Payer: Self-pay | Admitting: Cardiology

## 2024-02-01 ENCOUNTER — Ambulatory Visit: Attending: Cardiology | Admitting: Cardiology

## 2024-02-01 ENCOUNTER — Ambulatory Visit

## 2024-02-01 ENCOUNTER — Encounter: Payer: Self-pay | Admitting: Cardiology

## 2024-02-01 VITALS — BP 120/78 | HR 65 | Ht 63.0 in | Wt 155.4 lb

## 2024-02-01 DIAGNOSIS — Z8673 Personal history of transient ischemic attack (TIA), and cerebral infarction without residual deficits: Secondary | ICD-10-CM

## 2024-02-01 DIAGNOSIS — I10 Essential (primary) hypertension: Secondary | ICD-10-CM

## 2024-02-01 DIAGNOSIS — D509 Iron deficiency anemia, unspecified: Secondary | ICD-10-CM

## 2024-02-01 DIAGNOSIS — I639 Cerebral infarction, unspecified: Secondary | ICD-10-CM

## 2024-02-01 DIAGNOSIS — E119 Type 2 diabetes mellitus without complications: Secondary | ICD-10-CM | POA: Diagnosis not present

## 2024-02-01 DIAGNOSIS — E785 Hyperlipidemia, unspecified: Secondary | ICD-10-CM

## 2024-02-01 DIAGNOSIS — R011 Cardiac murmur, unspecified: Secondary | ICD-10-CM

## 2024-02-01 NOTE — Progress Notes (Unsigned)
 Enrolled for [employer] to mail a ZIO XT long term holter monitor to the patients address on file.

## 2024-02-01 NOTE — Progress Notes (Signed)
 Cardiology Office Note:    Date:  02/01/2024   ID:  Sarah Berger, DOB Apr 08, 1950, MRN [id number]  PCP:  Norleen Lynwood ORN, MD  Cardiologist:  None  Electrophysiologist:  None   Referring MD: Norleen Lynwood ORN, MD   Chief Complaint  Patient presents with   Cerebrovascular Accident    History of Present Illness:    Sarah Berger is a 73 y.o. female with a hx of hypertension, hyperlipidemia, diabetes, colon cancer, cognitive impariment presents for an initial visit.  She had a MRI in 2023 which showed chronic lacunar infarcts.  Denies any chest pain, dyspnea, lightheadedness, syncope, lower extremity edema, or palpitations.  Does not exercise.  Never smoked.  Family history includes mother has A-fib.   Past Medical History:  Diagnosis Date   Anemia    Hx of    Anemia, unspecified 10/23/2012   Colon cancer (HCC)    Diabetes mellitus without complication (HCC)    Patient denies   Hematochezia 06/21/2011   HTN (hypertension) 06/21/2011   Several mild readings in the past, has decline med tx   Hx of colonic polyp 10/08/1996   Colonoscopy-Dr. Nolon    Hyperlipidemia 06/21/2011   Impaired glucose tolerance 06/21/2011   Memory loss    Obesity 06/21/2011    Past Surgical History:  Procedure Laterality Date   ABDOMINAL HYSTERECTOMY  1995   bone surgery feet  1993   bone spurs   HEMICOLECTOMY  10/2011    Current Medications: No outpatient medications have been marked as taking for the 02/01/24 encounter (Office Visit) with Kate Lonni CROME, MD.     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  Vaping Use   Vaping status: Never Used  Substance and Sexual Activity   Alcohol use: No   Drug use: No   Sexual activity: Not Currently    Birth control/protection: Surgical  Other Topics Concern   Not on file  Social History Narrative   Not on file   Social Drivers of Health   Financial Resource Strain: Not on file  Food Insecurity: Not on file  Transportation Needs: Not on file  Physical Activity: Not on file  Stress: Not on file  Social Connections: Not on file     Family History: The patient's family history includes Colon polyps in her mother; Hypertension in her mother. There is no history of Colon cancer, Esophageal cancer, Rectal cancer, or Stomach cancer.  ROS:   Please see the history of present illness.     All other systems reviewed and are negative.  EKGs/Labs/Other Studies Reviewed:    The following studies were reviewed today:   EKG:  02/01/2024: Normal sinus rhythm, rate 65, nonspecific T wave flattening  Recent Labs: No results found for requested labs within last 365 days.  Recent Lipid Panel    Component Value Date/Time   CHOL 282 (H) 04/04/2022 9071  TRIG 81.0 04/04/2022 0928   HDL 76.60 04/04/2022 0928   CHOLHDL 4 04/04/2022 0928   VLDL 16.2 04/04/2022 0928   LDLCALC 189 (H) 04/04/2022 0928   LDLDIRECT 150.9 10/23/2012 0732    Physical Exam:    VS:  BP 120/78 (BP Location: Left Arm, Patient Position: Sitting, Cuff Size: Normal)   Pulse 65   Ht 5' 3 (1.6 m)   Wt 155 lb 6.4 oz (70.5 kg)   LMP 03/01/1987 (Approximate)   SpO2 98%   BMI 27.53 kg/m     Wt Readings from Last 3 Encounters:  02/01/24 155 lb 6.4 oz (70.5 kg)  07/06/22 147 lb 8 oz (66.9 kg)  04/29/22 146 lb (66.2 kg)     GEN:  Well nourished, well developed in no acute distress HEENT: Normal NECK: No JVD; No carotid bruits CARDIAC: RRR, 2-6  systolic murmur RESPIRATORY:  Clear to auscultation without rales, wheezing or rhonchi  ABDOMEN: Soft, non-tender, non-distended MUSCULOSKELETAL:  No edema; No deformity  SKIN: Warm and dry NEUROLOGIC:  Alert and oriented x 2, did not know year PSYCHIATRIC:  Normal affect   ASSESSMENT:    1. History of stroke   2. Hyperlipidemia, unspecified hyperlipidemia type   3. Iron deficiency anemia, unspecified iron deficiency anemia type   4. Type 2 diabetes mellitus without complication, without long-term current use of insulin (HCC)   5. Hypertension, unspecified type   6. Murmur    PLAN:    History of CVA: Brain MRI in 2023 showed chronic infarcts.  Check Zio patch x 2 weeks to evaluate for A-fib.  Check echocardiogram.  She reports she stopped taking all her medications.  Will start aspirin  81 mg daily.  Will check lipid panel and plan to start statin  Heart murmur: 2/6 systolic murmur, will check echocardiogram  Hyperlipidemia: Previously on on rosuvastatin  10 mg daily and Zetia  10 mg daily but reports she stopped all her medications.  Will check lipid panel and plan to start statin  T2DM: She was on metformin  but stopped all her medicines.  Check A1c.  Recommend PCP follow-up  Cognitive impairment: Follows with neurology  RTC in 6 months   Medication Adjustments/Labs and Tests Ordered: Current medicines are reviewed at length with the patient today.  Concerns regarding medicines are outlined above.  Orders Placed This Encounter  Procedures   CBC   Comprehensive metabolic panel with GFR   Hemoglobin A1c   Lipid panel   EKG 12-Lead   ECHOCARDIOGRAM COMPLETE   No orders of the defined types were placed in this encounter.   Patient Instructions  Medication Instructions:  Restart Aspirin  81 mg *If you need a refill on your cardiac medications before your next appointment, please call your pharmacy*  Lab Work: Lipid panel, HgA1C, CMP, CBC If you have labs (blood work)  drawn today and your tests are completely normal, you will receive your results only by: MyChart Message (if you have MyChart) OR A paper copy in the mail If you have any lab test that is abnormal or we need to change your treatment, we will call you to review the results.  Testing/Procedures: Your physician has requested that you have an echocardiogram. Echocardiography is a painless test that uses sound waves to create images of your heart. It provides your doctor with information about the size and shape of your heart and how well your heart's chambers and valves are working. This procedure takes approximately one hour. There are no restrictions for this  procedure. Please do NOT wear cologne, perfume, aftershave, or lotions (deodorant is allowed). Please arrive 15 minutes prior to your appointment time.  Please note: We ask at that you not bring children with you during ultrasound (echo/ vascular) testing. Due to room size and safety concerns, children are not allowed in the ultrasound rooms during exams. Our front office staff cannot provide observation of children in our lobby area while testing is being conducted. An adult accompanying a patient to their appointment will only be allowed in the ultrasound room at the discretion of the ultrasound technician under special circumstances. We apologize for any inconvenience.   Your physician has recommended that you wear a 14 DAY ZIO-PATCH monitor. The Zio patch cardiac monitor continuously records heart rhythm data for up to 14 days, this is for patients being evaluated for multiple types heart rhythms. For the first 24 hours post application, please avoid getting the Zio monitor wet in the shower or by excessive sweating during exercise. After that, feel free to carry on with regular activities. Keep soaps and lotions away from the ZIO XT Patch.  This will be mailed to you, please expect 7-10 days to receive.    Applying the monitor   Shave hair  from upper left chest.   Hold abrader disc by orange tab.  Rub abrader in 40 strokes over left upper chest as indicated in your monitor instructions.   Clean area with 4 enclosed alcohol pads .  Use all pads to assure are is cleaned thoroughly.  Let dry.   Apply patch as indicated in monitor instructions.  Patch will be place under collarbone on left side of chest with arrow pointing upward.   Rub patch adhesive wings for 2 minutes.Remove white label marked 1.  Remove white label marked 2.  Rub patch adhesive wings for 2 additional minutes.   While looking in a mirror, press and release button in center of patch.  A small green light will flash 3-4 times .  This will be your only indicator the monitor has been turned on.     Do not shower for the first 24 hours.  You may shower after the first 24 hours.   Press button if you feel a symptom. You will hear a small click.  Record Date, Time and Symptom in the Patient Log Book.   When you are ready to remove patch, follow instructions on last 2 pages of Patient Log Book.  Stick patch monitor onto last page of Patient Log Book.   Place Patient Log Book in [place] box.  Use locking tab on box and tape box closed securely.  The [employer] has [employer] on it.  Please place in mailbox as soon as possible.  Your physician should have your test results approximately 7 days after the monitor has been mailed back to [facility]   Call [facility] Customer Care at [phone] if you have questions regarding your ZIO XT patch monitor.  Call them immediately if you see an orange light blinking on your monitor.   If your monitor falls off in less than 4 days contact our Monitor department at [phone].  If your monitor becomes loose or falls off after 4 days call [employer] at [phone] for suggestions on securing your monitor   Follow-Up: At [facility], you and your health needs are our priority.  As part of our  continuing mission to provide you with exceptional heart care, our providers are all part  of one team.  This team includes your primary Cardiologist (physician) and Advanced Practice Providers or APPs (Physician Assistants and Nurse Practitioners) who all work together to provide you with the care you need, when you need it.  Your next appointment:  Please contact your Primary Care Provider for an appointment   Please contact your Neurologist and get an appointment to see them, to re-establish care  Dr Kate- 6 months  We recommend signing up for the patient portal called MyChart.  Sign up information is provided on this After Visit Summary.  MyChart is used to connect with patients for Virtual Visits (Telemedicine).  Patients are able to view lab/test results, encounter notes, upcoming appointments, etc.  Non-urgent messages can be sent to your provider as well.   To learn more about what you can do with MyChart, go to [email or web address].      Signed, Lonni LITTIE Kate, MD  02/01/2024 2:36 PM    [facility]

## 2024-02-01 NOTE — Patient Instructions (Addendum)
 Medication Instructions:  Restart Aspirin  81 mg *If you need a refill on your cardiac medications before your next appointment, please call your pharmacy*  Lab Work: Lipid panel, HgA1C, CMP, CBC If you have labs (blood work) drawn today and your tests are completely normal, you will receive your results only by: MyChart Message (if you have MyChart) OR A paper copy in the mail If you have any lab test that is abnormal or we need to change your treatment, we will call you to review the results.  Testing/Procedures: Your physician has requested that you have an echocardiogram. Echocardiography is a painless test that uses sound waves to create images of your heart. It provides your doctor with information about the size and shape of your heart and how well your heart's chambers and valves are working. This procedure takes approximately one hour. There are no restrictions for this procedure. Please do NOT wear cologne, perfume, aftershave, or lotions (deodorant is allowed). Please arrive 15 minutes prior to your appointment time.  Please note: We ask at that you not bring children with you during ultrasound (echo/ vascular) testing. Due to room size and safety concerns, children are not allowed in the ultrasound rooms during exams. Our front office staff cannot provide observation of children in our lobby area while testing is being conducted. An adult accompanying a patient to their appointment will only be allowed in the ultrasound room at the discretion of the ultrasound technician under special circumstances. We apologize for any inconvenience.   Your physician has recommended that you wear a 14 DAY ZIO-PATCH monitor. The Zio patch cardiac monitor continuously records heart rhythm data for up to 14 days, this is for patients being evaluated for multiple types heart rhythms. For the first 24 hours post application, please avoid getting the Zio monitor wet in the shower or by excessive sweating  during exercise. After that, feel free to carry on with regular activities. Keep soaps and lotions away from the ZIO XT Patch.  This will be mailed to you, please expect 7-10 days to receive.    Applying the monitor   Shave hair from upper left chest.   Hold abrader disc by orange tab.  Rub abrader in 40 strokes over left upper chest as indicated in your monitor instructions.   Clean area with 4 enclosed alcohol pads .  Use all pads to assure are is cleaned thoroughly.  Let dry.   Apply patch as indicated in monitor instructions.  Patch will be place under collarbone on left side of chest with arrow pointing upward.   Rub patch adhesive wings for 2 minutes.Remove white label marked 1.  Remove white label marked 2.  Rub patch adhesive wings for 2 additional minutes.   While looking in a mirror, press and release button in center of patch.  A small green light will flash 3-4 times .  This will be your only indicator the monitor has been turned on.     Do not shower for the first 24 hours.  You may shower after the first 24 hours.   Press button if you feel a symptom. You will hear a small click.  Record Date, Time and Symptom in the Patient Log Book.   When you are ready to remove patch, follow instructions on last 2 pages of Patient Log Book.  Stick patch monitor onto last page of Patient Log Book.   Place Patient Log Book in [place] box.  Use locking tab on box and  tape box closed securely.  The [employer] has [employer] on it.  Please place in mailbox as soon as possible.  Your physician should have your test results approximately 7 days after the monitor has been mailed back to [facility].   Call [facility] Customer Care at [phone] if you have questions regarding your ZIO XT patch monitor.  Call them immediately if you see an orange light blinking on your monitor.   If your monitor falls off in less than 4 days contact our Monitor department at [phone].   If your monitor becomes loose or falls off after 4 days call [employer] at [phone] for suggestions on securing your monitor   Follow-Up: At [facility], you and your health needs are our priority.  As part of our continuing mission to provide you with exceptional heart care, our providers are all part of one team.  This team includes your primary Cardiologist (physician) and Advanced Practice Providers or APPs (Physician Assistants and Nurse Practitioners) who all work together to provide you with the care you need, when you need it.  Your next appointment:  Please contact your Primary Care Provider for an appointment   Please contact your Neurologist and get an appointment to see them, to re-establish care  Dr Kate- 6 months  We recommend signing up for the patient portal called MyChart.  Sign up information is provided on this After Visit Summary.  MyChart is used to connect with patients for Virtual Visits (Telemedicine).  Patients are able to view lab/test results, encounter notes, upcoming appointments, etc.  Non-urgent messages can be sent to your provider as well.   To learn more about what you can do with MyChart, go to [email or web address].

## 2024-02-02 ENCOUNTER — Ambulatory Visit: Payer: Self-pay | Admitting: Cardiology

## 2024-02-02 LAB — COMPREHENSIVE METABOLIC PANEL WITH GFR
ALT: 6 IU/L (ref 0–32)
AST: 13 IU/L (ref 0–40)
Albumin: 4.5 g/dL (ref 3.8–4.8)
Alkaline Phosphatase: 89 IU/L (ref 49–135)
BUN/Creatinine Ratio: 13 (ref 12–28)
BUN: 11 mg/dL (ref 8–27)
Bilirubin Total: 0.9 mg/dL (ref 0.0–1.2)
CO2: 26 mmol/L (ref 20–29)
Calcium: 9.6 mg/dL (ref 8.7–10.3)
Chloride: 101 mmol/L (ref 96–106)
Creatinine, Ser: 0.86 mg/dL (ref 0.57–1.00)
Globulin, Total: 3 g/dL (ref 1.5–4.5)
Glucose: 139 mg/dL — AB (ref 70–99)
Potassium: 4.1 mmol/L (ref 3.5–5.2)
Sodium: 142 mmol/L (ref 134–144)
Total Protein: 7.5 g/dL (ref 6.0–8.5)
eGFR: 71 mL/min/1.73 (ref >59)

## 2024-02-02 LAB — LIPID PANEL
Chol/HDL Ratio: 4.5 ratio — AB (ref 0.0–4.4)
Cholesterol, Total: 324 mg/dL — AB (ref 100–199)
HDL: 72 mg/dL (ref >39)
LDL Chol Calc (NIH): 229 mg/dL — AB (ref 0–99)
Triglycerides: 131 mg/dL (ref 0–149)
VLDL Cholesterol Cal: 23 mg/dL (ref 5–40)

## 2024-02-02 LAB — CBC
Hematocrit: 34.8 % (ref 34.0–46.6)
Hemoglobin: 10.4 g/dL — ABNORMAL LOW (ref 11.1–15.9)
MCH: 23.3 pg — ABNORMAL LOW (ref 26.6–33.0)
MCHC: 29.9 g/dL — ABNORMAL LOW (ref 31.5–35.7)
MCV: 78 fL — ABNORMAL LOW (ref 79–97)
Platelets: 276 x10E3/uL (ref 150–450)
RBC: 4.46 x10E6/uL (ref 3.77–5.28)
RDW: 15.1 % (ref 11.7–15.4)
WBC: 6.5 x10E3/uL (ref 3.4–10.8)

## 2024-02-02 LAB — HEMOGLOBIN A1C
Est. average glucose Bld gHb Est-mCnc: 192 mg/dL
Hgb A1c MFr Bld: 8.3 % — AB (ref 4.8–5.6)

## 2024-02-27 NOTE — Progress Notes (Signed)
 Sarah Berger                                          MRN: [id number]   02/27/2024   The [facility] Quality Team Specialist reviewed this patient medical record for the purposes of chart review for care gap closure. The following were reviewed: abstraction for care gap closure-glycemic status assessment.    [facility]BCI Quality Team

## 2024-02-27 NOTE — Progress Notes (Signed)
 Sarah Berger                                          MRN: [id number]   02/27/2024   The [facility]I Quality Team Specialist reviewed this patient medical record for the purposes of chart review for care gap closure. The following were reviewed: chart review for care gap closure-diabetic eye exam and kidney health evaluation for diabetes:eGFR  and uACR.    [facility]BCI Quality Team

## 2024-03-14 ENCOUNTER — Ambulatory Visit (HOSPITAL_COMMUNITY): Attending: Internal Medicine

## 2024-03-15 ENCOUNTER — Encounter (HOSPITAL_COMMUNITY): Payer: Self-pay | Admitting: Cardiology

## 2024-04-16 ENCOUNTER — Ambulatory Visit: Admitting: Neurology
